# Patient Record
Sex: Female | Born: 1955 | Race: Black or African American | Hispanic: No | Marital: Married | Smoking: Never smoker
Health system: Southern US, Community
[De-identification: ages and names within clinical notes are randomized; demographics above are authoritative.]

## PROBLEM LIST (undated history)

## (undated) DIAGNOSIS — I10 Essential (primary) hypertension: Secondary | ICD-10-CM

## (undated) HISTORY — PX: ROTATOR CUFF REPAIR: SHX139

## (undated) HISTORY — PX: ABDOMINAL HYSTERECTOMY: SHX81

---

## 1993-06-03 HISTORY — PX: BREAST BIOPSY: SHX20

## 2000-10-17 ENCOUNTER — Encounter: Payer: Self-pay | Admitting: Emergency Medicine

## 2000-10-17 ENCOUNTER — Emergency Department (HOSPITAL_COMMUNITY): Admission: EM | Admit: 2000-10-17 | Discharge: 2000-10-18 | Payer: Self-pay | Admitting: Emergency Medicine

## 2004-04-15 ENCOUNTER — Emergency Department: Payer: Self-pay | Admitting: Emergency Medicine

## 2004-05-03 ENCOUNTER — Ambulatory Visit: Payer: Self-pay | Admitting: Internal Medicine

## 2005-04-01 ENCOUNTER — Inpatient Hospital Stay: Payer: Self-pay | Admitting: Internal Medicine

## 2005-05-10 ENCOUNTER — Ambulatory Visit: Payer: Self-pay | Admitting: Internal Medicine

## 2005-06-06 ENCOUNTER — Ambulatory Visit: Payer: Self-pay | Admitting: Internal Medicine

## 2006-01-20 ENCOUNTER — Inpatient Hospital Stay: Payer: Self-pay | Admitting: Internal Medicine

## 2006-04-29 ENCOUNTER — Ambulatory Visit: Payer: Self-pay | Admitting: Internal Medicine

## 2006-05-16 ENCOUNTER — Ambulatory Visit: Payer: Self-pay | Admitting: Podiatry

## 2006-06-25 ENCOUNTER — Ambulatory Visit: Payer: Self-pay | Admitting: Internal Medicine

## 2006-07-03 ENCOUNTER — Ambulatory Visit: Payer: Self-pay | Admitting: Internal Medicine

## 2006-10-19 ENCOUNTER — Inpatient Hospital Stay: Payer: Self-pay | Admitting: Internal Medicine

## 2007-03-18 ENCOUNTER — Ambulatory Visit: Payer: Self-pay | Admitting: Internal Medicine

## 2007-03-25 ENCOUNTER — Ambulatory Visit: Payer: Self-pay | Admitting: Internal Medicine

## 2007-05-25 ENCOUNTER — Emergency Department: Payer: Self-pay | Admitting: Emergency Medicine

## 2007-08-20 ENCOUNTER — Ambulatory Visit: Payer: Self-pay | Admitting: Internal Medicine

## 2008-01-02 ENCOUNTER — Ambulatory Visit: Payer: Self-pay | Admitting: Unknown Physician Specialty

## 2008-02-27 ENCOUNTER — Inpatient Hospital Stay: Payer: Self-pay | Admitting: Unknown Physician Specialty

## 2008-02-27 ENCOUNTER — Other Ambulatory Visit: Payer: Self-pay

## 2008-06-21 ENCOUNTER — Emergency Department: Payer: Self-pay | Admitting: Internal Medicine

## 2008-06-23 ENCOUNTER — Ambulatory Visit: Payer: Self-pay | Admitting: Internal Medicine

## 2008-07-05 ENCOUNTER — Ambulatory Visit: Payer: Self-pay | Admitting: Internal Medicine

## 2009-01-15 ENCOUNTER — Emergency Department: Payer: Self-pay | Admitting: Emergency Medicine

## 2009-02-26 ENCOUNTER — Emergency Department: Payer: Self-pay | Admitting: Emergency Medicine

## 2009-02-27 ENCOUNTER — Ambulatory Visit: Payer: Self-pay | Admitting: Internal Medicine

## 2009-10-06 ENCOUNTER — Emergency Department: Payer: Self-pay | Admitting: Emergency Medicine

## 2009-11-13 ENCOUNTER — Emergency Department: Payer: Self-pay | Admitting: Emergency Medicine

## 2010-08-20 ENCOUNTER — Emergency Department: Payer: Self-pay | Admitting: Emergency Medicine

## 2010-12-26 ENCOUNTER — Inpatient Hospital Stay: Payer: Self-pay | Admitting: Internal Medicine

## 2010-12-31 LAB — PATHOLOGY REPORT

## 2012-06-15 ENCOUNTER — Emergency Department: Payer: Self-pay | Admitting: Emergency Medicine

## 2012-06-16 ENCOUNTER — Emergency Department: Payer: Self-pay | Admitting: Emergency Medicine

## 2012-07-03 DIAGNOSIS — I10 Essential (primary) hypertension: Secondary | ICD-10-CM | POA: Diagnosis present

## 2013-09-15 ENCOUNTER — Emergency Department: Payer: Self-pay | Admitting: Internal Medicine

## 2013-09-15 LAB — CBC WITH DIFFERENTIAL/PLATELET
Basophil #: 0.1 10*3/uL (ref 0.0–0.1)
Basophil %: 1.7 %
Eosinophil #: 0 10*3/uL (ref 0.0–0.7)
Eosinophil %: 0.6 %
HCT: 45.6 % (ref 35.0–47.0)
HGB: 15.4 g/dL (ref 12.0–16.0)
Lymphocyte #: 2.9 10*3/uL (ref 1.0–3.6)
Lymphocyte %: 46.9 %
MCH: 32.1 pg (ref 26.0–34.0)
MCHC: 33.7 g/dL (ref 32.0–36.0)
MCV: 95 fL (ref 80–100)
Monocyte #: 0.6 x10 3/mm (ref 0.2–0.9)
Monocyte %: 9.6 %
Neutrophil #: 2.6 10*3/uL (ref 1.4–6.5)
Neutrophil %: 41.2 %
Platelet: 99 10*3/uL — ABNORMAL LOW (ref 150–440)
RBC: 4.79 10*6/uL (ref 3.80–5.20)
RDW: 12.5 % (ref 11.5–14.5)
WBC: 6.2 10*3/uL (ref 3.6–11.0)

## 2013-09-15 LAB — COMPREHENSIVE METABOLIC PANEL
Albumin: 3.8 g/dL (ref 3.4–5.0)
Alkaline Phosphatase: 147 U/L — ABNORMAL HIGH
Anion Gap: 6 — ABNORMAL LOW (ref 7–16)
BUN: 13 mg/dL (ref 7–18)
Bilirubin,Total: 1.4 mg/dL — ABNORMAL HIGH (ref 0.2–1.0)
Calcium, Total: 9.5 mg/dL (ref 8.5–10.1)
Chloride: 100 mmol/L (ref 98–107)
Co2: 30 mmol/L (ref 21–32)
Creatinine: 0.73 mg/dL (ref 0.60–1.30)
EGFR (African American): >60
EGFR (Non-African Amer.): >60
Glucose: 100 mg/dL — ABNORMAL HIGH (ref 65–99)
Osmolality: 272 (ref 275–301)
Potassium: 3.3 mmol/L — ABNORMAL LOW (ref 3.5–5.1)
SGOT(AST): 138 U/L — ABNORMAL HIGH (ref 15–37)
SGPT (ALT): 171 U/L — ABNORMAL HIGH (ref 12–78)
Sodium: 136 mmol/L (ref 136–145)
Total Protein: 8.8 g/dL — ABNORMAL HIGH (ref 6.4–8.2)

## 2013-09-15 LAB — LIPASE, BLOOD: Lipase: 205 U/L (ref 73–393)

## 2013-09-15 LAB — TROPONIN I: Troponin-I: <0.02 ng/mL

## 2015-08-22 DIAGNOSIS — D696 Thrombocytopenia, unspecified: Secondary | ICD-10-CM | POA: Diagnosis present

## 2015-12-03 ENCOUNTER — Emergency Department
Admission: EM | Admit: 2015-12-03 | Discharge: 2015-12-03 | Disposition: A | Discharge status: Home / Self Care | Payer: BLUE CROSS/BLUE SHIELD | Attending: Emergency Medicine | Admitting: Emergency Medicine

## 2015-12-03 ENCOUNTER — Encounter: Payer: Self-pay | Admitting: Emergency Medicine

## 2015-12-03 DIAGNOSIS — R1030 Lower abdominal pain, unspecified: Secondary | ICD-10-CM | POA: Diagnosis present

## 2015-12-03 DIAGNOSIS — Z79899 Other long term (current) drug therapy: Secondary | ICD-10-CM | POA: Diagnosis not present

## 2015-12-03 DIAGNOSIS — N3090 Cystitis, unspecified without hematuria: Secondary | ICD-10-CM | POA: Insufficient documentation

## 2015-12-03 LAB — URINALYSIS COMPLETE WITH MICROSCOPIC (ARMC ONLY)
BILIRUBIN URINE: NEGATIVE
Glucose, UA: NEGATIVE mg/dL
Hgb urine dipstick: NEGATIVE
KETONES UR: NEGATIVE mg/dL
NITRITE: POSITIVE — AB
PROTEIN: NEGATIVE mg/dL
Specific Gravity, Urine: 1.015 (ref 1.005–1.030)
pH: 6 (ref 5.0–8.0)

## 2015-12-03 LAB — BASIC METABOLIC PANEL
ANION GAP: 7 (ref 5–15)
BUN: 13 mg/dL (ref 6–20)
CO2: 28 mmol/L (ref 22–32)
Calcium: 9.3 mg/dL (ref 8.9–10.3)
Chloride: 105 mmol/L (ref 101–111)
Creatinine, Ser: 0.77 mg/dL (ref 0.44–1.00)
GLUCOSE: 98 mg/dL (ref 65–99)
POTASSIUM: 3.6 mmol/L (ref 3.5–5.1)
Sodium: 140 mmol/L (ref 135–145)

## 2015-12-03 LAB — CBC WITH DIFFERENTIAL/PLATELET
BASOS ABS: 0 10*3/uL (ref 0–0.1)
Basophils Relative: 1 %
Eosinophils Absolute: 0.1 10*3/uL (ref 0–0.7)
Eosinophils Relative: 2 %
HEMATOCRIT: 41.7 % (ref 35.0–47.0)
HEMOGLOBIN: 14.9 g/dL (ref 12.0–16.0)
LYMPHS PCT: 40 %
Lymphs Abs: 1.8 10*3/uL (ref 1.0–3.6)
MCH: 33.5 pg (ref 26.0–34.0)
MCHC: 35.6 g/dL (ref 32.0–36.0)
MCV: 94.2 fL (ref 80.0–100.0)
MONO ABS: 0.5 10*3/uL (ref 0.2–0.9)
Monocytes Relative: 11 %
NEUTROS ABS: 2.1 10*3/uL (ref 1.4–6.5)
NEUTROS PCT: 46 %
Platelets: 110 10*3/uL — ABNORMAL LOW (ref 150–440)
RBC: 4.43 MIL/uL (ref 3.80–5.20)
RDW: 13.5 % (ref 11.5–14.5)
WBC: 4.5 10*3/uL (ref 3.6–11.0)

## 2015-12-03 MED ORDER — NITROFURANTOIN MACROCRYSTAL 100 MG PO CAPS: 100.0000 mg | ORAL_CAPSULE | Freq: Two times a day (BID) | ORAL | Status: DC

## 2015-12-03 NOTE — Discharge Instructions (Signed)

## 2015-12-03 NOTE — ED Notes (Signed)
C/O lower abdominal and low back pain x 2 days.  Symptoms worsening last night.  Having urinary frequency.

## 2015-12-03 NOTE — ED Notes (Signed)
Pt verbalized understanding of discharge instructions. NAD at this time. 

## 2015-12-03 NOTE — ED Provider Notes (Signed)
[facility] Emergency Department Provider Note  ____________________________________________  Time seen: 7:15 AM  I have reviewed the triage vital signs and the nursing notes.   HISTORY  Chief Complaint Abdominal Pain    HPI Sherri Wagner is a 60 y.o. female who complains of diffuse lower abdominal pain radiating to her lower back for the past 2 days. Gradual onset, onset during rest. No recent trips slips or falls or other injuries. Worse when she turns on her side. No nausea vomiting diarrhea. No fever chills or sweats. No dizziness or syncope. No rectal bleeding. No constipation. Is having some urinary frequency but denies dysuria. No abnormal vaginal bleeding or discharge. Denies any history of ovarian cysts or abdominal surgeries.     History reviewed. No pertinent past medical history.   There are no active problems to display for this patient.    History reviewed. No pertinent past surgical history. Foot surgery  Current Outpatient Rx  Name  Route  Sig  Dispense  Refill  . Cholecalciferol (VITAMIN D3) 1000 units CAPS   Oral   Take 1,000 Units by mouth daily.         . ferrous sulfate 325 (65 FE) MG tablet   Oral   Take 325 mg by mouth daily.         . Multiple Vitamin (MULTIVITAMIN) tablet   Oral   Take 1 tablet by mouth daily.         . Omega-3 Fatty Acids (FISH OIL) 1000 MG CAPS   Oral   Take 1,000 mg by mouth 2 (two) times daily.         . vitamin B-12 (CYANOCOBALAMIN) 500 MCG tablet   Oral   Take 500 mcg by mouth daily.         . nitrofurantoin (MACRODANTIN) 100 MG capsule   Oral   Take 1 capsule (100 mg total) by mouth 2 (two) times daily.   6 capsule   0      Allergies Review of patient's allergies indicates no known allergies.   No family history on file.  Social History Social History  Substance Use Topics  . Smoking status: Never Smoker   . Smokeless tobacco: None  . Alcohol Use: Yes      Comment: occasionally    Review of Systems  Constitutional:   No fever or chills.  ENT:   No sore throat. No rhinorrhea. Cardiovascular:   No chest pain. Respiratory:   No dyspnea or cough. Gastrointestinal:   Lower abdominal pain as above without vomiting or diarrhea.  Genitourinary:   Negative for dysuria or difficulty urinating. Musculoskeletal:   Negative for focal pain or swelling Neurological:   Negative for headaches 10-point ROS otherwise negative.  ____________________________________________   PHYSICAL EXAM:  VITAL SIGNS: ED Triage Vitals  Enc Vitals Group     BP 12/03/15 0703 115/74 mmHg     Pulse Rate 12/03/15 0703 61     Resp 12/03/15 0703 16     Temp 12/03/15 0703 97.7 F (36.5 C)     Temp Source 12/03/15 0703 Oral     SpO2 12/03/15 0703 100 %     Weight 12/03/15 0703 165 lb (74.844 kg)     Height 12/03/15 0703 5\' 7"  (1.702 m)     Head Cir --      Peak Flow --      Pain Score 12/03/15 0705 8     Pain Loc --  Pain Edu? --      Excl. in GC? --     Vital signs reviewed, nursing assessments reviewed.   Constitutional:   Alert and oriented. Well appearing and in no distress. Eyes:   No scleral icterus. No conjunctival pallor. PERRL. EOMI.  No nystagmus. ENT   Head:   Normocephalic and atraumatic.   Nose:   No congestion/rhinnorhea. No septal hematoma   Mouth/Throat:   MMM, no pharyngeal erythema. No peritonsillar mass.    Neck:   No stridor. No SubQ emphysema. No meningismus. Hematological/Lymphatic/Immunilogical:   No cervical lymphadenopathy. Cardiovascular:   RRR. Symmetric bilateral radial and DP pulses.  No murmurs.  Respiratory:   Normal respiratory effort without tachypnea nor retractions. Breath sounds are clear and equal bilaterally. No wheezes/rales/rhonchi. Gastrointestinal:   Soft With left lower quadrant tenderness. Non distended. There is no CVA tenderness.  No rebound, rigidity, or guarding. Genitourinary:    deferred Musculoskeletal:   Nontender with normal range of motion in all extremities. No joint effusions.  No lower extremity tenderness.  No edema. Neurologic:   Normal speech and language.  CN 2-10 normal. Motor grossly intact. No gross focal neurologic deficits are appreciated.  Skin:    Skin is warm, dry and intact. No rash noted.  No petechiae, purpura, or bullae.  ____________________________________________    LABS (pertinent positives/negatives) (all labs ordered are listed, but only abnormal results are displayed) Labs Reviewed  URINALYSIS COMPLETEWITH MICROSCOPIC ([facility] ONLY) - Abnormal; Notable for the following:    Color, Urine YELLOW (*)    APPearance CLOUDY (*)    Nitrite POSITIVE (*)    Leukocytes, UA 3+ (*)    Bacteria, UA MANY (*)    Squamous Epithelial / LPF 6-30 (*)    All other components within normal limits  CBC WITH DIFFERENTIAL/PLATELET - Abnormal; Notable for the following:    Platelets 110 (*)    All other components within normal limits  URINE CULTURE  BASIC METABOLIC PANEL   ____________________________________________   EKG    ____________________________________________    RADIOLOGY    ____________________________________________   PROCEDURES   ____________________________________________   INITIAL IMPRESSION / ASSESSMENT AND PLAN / ED COURSE  Pertinent labs & imaging results that were available during my care of the patient were reviewed by me and considered in my medical decision making (see chart for details).  Patient presents with abdominal pain, left lower quadrant tenderness. Check labs and urinalysis. If negative, likely proceed to CT scan to evaluate for diverticulitis versus ovarian cyst.   ----------------------------------------- 9:06 AM on 12/03/2015 -----------------------------------------  Labs show clear nitrite positive urinary tract infection. Vital signs normal. No evidence of pyelonephritis or sepsis.  Discharge home on Macrobid, follow-up with primary care. Urine culture sent.    ____________________________________________   FINAL CLINICAL IMPRESSION(S) / ED DIAGNOSES  Final diagnoses:  Cystitis       Portions of this note were generated with dragon dictation software. Dictation errors may occur despite best attempts at proofreading.   Sharman CheekPhillip Lawson Mahone, MD 12/03/15 [phone]

## 2015-12-05 LAB — URINE CULTURE: Culture: >=100000 — AB

## 2016-02-07 ENCOUNTER — Emergency Department
Admission: EM | Admit: 2016-02-07 | Discharge: 2016-02-07 | Disposition: A | Discharge status: Home / Self Care | Payer: BLUE CROSS/BLUE SHIELD | Attending: Emergency Medicine | Admitting: Emergency Medicine

## 2016-02-07 ENCOUNTER — Encounter: Payer: Self-pay | Admitting: *Deleted

## 2016-02-07 DIAGNOSIS — M5432 Sciatica, left side: Secondary | ICD-10-CM

## 2016-02-07 DIAGNOSIS — M79605 Pain in left leg: Secondary | ICD-10-CM | POA: Diagnosis present

## 2016-02-07 MED ORDER — HYDROCODONE-ACETAMINOPHEN 5-325 MG PO TABS: 1.0000 | ORAL_TABLET | ORAL | 0 refills | Status: DC | PRN

## 2016-02-07 MED ORDER — HYDROCODONE-ACETAMINOPHEN 5-325 MG PO TABS
1.0000 | ORAL_TABLET | Freq: Once | ORAL | Status: AC
  Administered 2016-02-07: 1 via ORAL
  Filled 2016-02-07: qty 1

## 2016-02-07 NOTE — ED Notes (Signed)
Pt reports being dx with siatica last week.  Took prednisone without relief. MAE, +pulse, motor and sensation to foot.  NAD.

## 2016-02-07 NOTE — ED Provider Notes (Signed)
[facility] Emergency Department Provider Note   ____________________________________________   First MD Initiated Contact with Patient 02/07/16 0940     (approximate)  I have reviewed the triage vital signs and the nursing notes.   HISTORY  Chief Complaint Leg Pain   HPI Sherri Wagner is a 60 y.o. female who presents with pain of the left leg.  Pain radiates from the left hip down to the lower leg and is described as a tingling pain and sensation of muscle tightness.  Pain is worse when standing or walking and improves with sitting or lying down.  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  Denies any history of trauma.  Patient saw her PCP about 1 week ago and was diagnosed with sciatica.  She was prescribed diclofenac and methylprednisolone, which she has been taking as directed with no relief of pain.  Patient has also been following the exercises prescribed by the PCP with a fair degree of pain.  She also has tried using [employer], Volteran gel, and heating her left hip and leg without relief of symptoms.  She has an appointment with a provider in her PCP's office scheduled for tomorrow, but the severity of the pain caused her to come to the ED today.    History reviewed. No pertinent past medical history.  There are no active problems to display for this patient.   History reviewed. No pertinent surgical history.  Prior to Admission medications   Medication Sig Start Date End Date Taking? Authorizing Provider  Cholecalciferol (VITAMIN D3) 1000 units CAPS Take 1,000 Units by mouth daily.    Historical Provider, MD  ferrous sulfate 325 (65 FE) MG tablet Take 325 mg by mouth daily.    Historical Provider, MD  HYDROcodone-acetaminophen (NORCO/VICODIN) 5-325 MG tablet Take 1 tablet by mouth every 4 (four) hours as needed for moderate pain. 02/07/16   Tommi Rumps, PA-C  Multiple Vitamin (MULTIVITAMIN) tablet Take 1 tablet by mouth daily.     Historical Provider, MD  nitrofurantoin (MACRODANTIN) 100 MG capsule Take 1 capsule (100 mg total) by mouth 2 (two) times daily. 12/03/15   Sharman Cheek, MD  Omega-3 Fatty Acids (FISH OIL) 1000 MG CAPS Take 1,000 mg by mouth 2 (two) times daily.    Historical Provider, MD  vitamin B-12 (CYANOCOBALAMIN) 500 MCG tablet Take 500 mcg by mouth daily.    Historical Provider, MD    Allergies Review of patient's allergies indicates no known allergies.  No family history on file.  Social History Social History  Substance Use Topics  . Smoking status: Never Smoker  . Smokeless tobacco: Never Used  . Alcohol use Yes     Comment: occasionally    Review of Systems Constitutional: No fever/chills Gastrointestinal: No abdominal pain. Negative for bowel incontinence. Genitourinary: Negative for bladder incontinence. Musculoskeletal: Negative for back pain, but endorses left hip pain and left leg pain. Neurological: Negative for headaches, focal weakness or numbness. Patient endorses tingling sensation throughout the left leg.  10-point ROS otherwise negative.  ____________________________________________   PHYSICAL EXAM:  VITAL SIGNS: ED Triage Vitals  Enc Vitals Group     BP 02/07/16 0847 138/88     Pulse Rate 02/07/16 0847 68     Resp 02/07/16 0847 20     Temp 02/07/16 0847 97.4 F (36.3 C)     Temp Source 02/07/16 0847 Oral     SpO2 02/07/16 0847 99 %     Weight  02/07/16 0844 160 lb (72.6 kg)     Height 02/07/16 0844 5\' 7"  (1.702 m)     Head Circumference --      Peak Flow --      Pain Score 02/07/16 0844 10     Pain Loc --      Pain Edu? --      Excl. in GC? --     Constitutional: Alert and oriented. Well appearing and in no acute distress. Cardiovascular: Normal rate, regular rhythm. Grossly normal heart sounds.  Good peripheral circulation.  Distal pulses intact bilaterally. Respiratory: Normal respiratory effort.  No retractions. Lungs CTAB. Musculoskeletal: Full ROM  of hips, knees, and ankles bilaterally.  Muscle strength 5/5, except pain limited strength of left hip flexion.  No lower extremity edema.  Neurologic:  Normal speech and language. No gross focal neurologic deficits are appreciated. No gait instability. Sensation grossly intact.Reflexes were 1+ bilaterally. Skin:  Skin is warm, dry and intact. No rash noted. Psychiatric: Mood and affect are normal. Speech and behavior are normal.    PROCEDURES  Procedure(s) performed: None  Procedures  Critical Care performed: No  ____________________________________________   INITIAL IMPRESSION / ASSESSMENT AND PLAN / ED COURSE  Patient appears to be experiencing continued symptoms of sciatica with leg pain and tingling.  The pain she is experiencing is severe enough to limit her daily activities.  As her current medications and conservative treatments are not improving symptoms, will provide patient with 2 day supply of Vicodin.  She will follow-up with her PCP office tomorrow to discuss long-term treatment options.  Encouraged patient to continue exercises as tolerated.     Clinical Course   Patient is to keep her appointment with her primary care doctor tomorrow. Patient is to take Vicodin as directed and continue her current medication.  ____________________________________________   FINAL CLINICAL IMPRESSION(S) / ED DIAGNOSES  Final diagnoses:  Sciatica without back pain, left      NEW MEDICATIONS STARTED DURING THIS VISIT:  Discharge Medication List as of 02/07/2016 10:19 AM    START taking these medications   Details  HYDROcodone-acetaminophen (NORCO/VICODIN) 5-325 MG tablet Take 1 tablet by mouth every 4 (four) hours as needed for moderate pain., Starting Wed 02/07/2016, Print         Note:  This document was prepared using Dragon voice recognition software and may include unintentional dictation errors.    Tommi RumpsRhonda L Silvina Hackleman, PA-C 02/07/16 1346    Arnaldo NatalPaul F Malinda,  MD 02/07/16 1452

## 2016-02-07 NOTE — ED Triage Notes (Signed)
Pt complains of left leg pain starting at buttocks down to foot, pt saw PCP last wek placed on prednisone, pt reports no relief, no swelling noted

## 2016-04-17 DIAGNOSIS — G4733 Obstructive sleep apnea (adult) (pediatric): Secondary | ICD-10-CM | POA: Diagnosis present

## 2016-07-14 ENCOUNTER — Emergency Department: Payer: BLUE CROSS/BLUE SHIELD

## 2016-07-14 ENCOUNTER — Emergency Department
Admission: EM | Admit: 2016-07-14 | Discharge: 2016-07-14 | Disposition: A | Discharge status: Home / Self Care | Payer: BLUE CROSS/BLUE SHIELD | Attending: Emergency Medicine | Admitting: Emergency Medicine

## 2016-07-14 ENCOUNTER — Encounter: Payer: Self-pay | Admitting: Emergency Medicine

## 2016-07-14 DIAGNOSIS — J069 Acute upper respiratory infection, unspecified: Secondary | ICD-10-CM | POA: Insufficient documentation

## 2016-07-14 DIAGNOSIS — R05 Cough: Secondary | ICD-10-CM | POA: Diagnosis present

## 2016-07-14 DIAGNOSIS — Z79899 Other long term (current) drug therapy: Secondary | ICD-10-CM | POA: Diagnosis not present

## 2016-07-14 LAB — TROPONIN I: Troponin I: <0.03 ng/mL (ref <0.03)

## 2016-07-14 LAB — INFLUENZA PANEL BY PCR (TYPE A & B)
INFLAPCR: NEGATIVE
INFLBPCR: NEGATIVE

## 2016-07-14 LAB — COMPREHENSIVE METABOLIC PANEL
ALK PHOS: 54 U/L (ref 38–126)
ALT: 72 U/L — AB (ref 14–54)
ANION GAP: 7 (ref 5–15)
AST: 188 U/L — ABNORMAL HIGH (ref 15–41)
Albumin: 4.9 g/dL (ref 3.5–5.0)
BUN: 17 mg/dL (ref 6–20)
CALCIUM: 9.4 mg/dL (ref 8.9–10.3)
CO2: 29 mmol/L (ref 22–32)
CREATININE: 0.86 mg/dL (ref 0.44–1.00)
Chloride: 104 mmol/L (ref 101–111)
Glucose, Bld: 102 mg/dL — ABNORMAL HIGH (ref 65–99)
Potassium: 3.5 mmol/L (ref 3.5–5.1)
SODIUM: 140 mmol/L (ref 135–145)
Total Bilirubin: 1.2 mg/dL (ref 0.3–1.2)
Total Protein: 8.5 g/dL — ABNORMAL HIGH (ref 6.5–8.1)

## 2016-07-14 LAB — CBC WITH DIFFERENTIAL/PLATELET
Basophils Absolute: 0 10*3/uL (ref 0–0.1)
Basophils Relative: 0 %
EOS ABS: 0 10*3/uL (ref 0–0.7)
EOS PCT: 0 %
HCT: 42.1 % (ref 35.0–47.0)
HEMOGLOBIN: 14.7 g/dL (ref 12.0–16.0)
LYMPHS ABS: 1 10*3/uL (ref 1.0–3.6)
LYMPHS PCT: 12 %
MCH: 32 pg (ref 26.0–34.0)
MCHC: 35.1 g/dL (ref 32.0–36.0)
MCV: 91.3 fL (ref 80.0–100.0)
MONOS PCT: 5 %
Monocytes Absolute: 0.4 10*3/uL (ref 0.2–0.9)
Neutro Abs: 6.7 10*3/uL — ABNORMAL HIGH (ref 1.4–6.5)
Neutrophils Relative %: 83 %
PLATELETS: 121 10*3/uL — AB (ref 150–440)
RBC: 4.61 MIL/uL (ref 3.80–5.20)
RDW: 12.5 % (ref 11.5–14.5)
WBC: 8.2 10*3/uL (ref 3.6–11.0)

## 2016-07-14 MED ORDER — IBUPROFEN 600 MG PO TABS
600.0000 mg | ORAL_TABLET | Freq: Once | ORAL | Status: AC
  Administered 2016-07-14: 600 mg via ORAL
  Filled 2016-07-14: qty 1

## 2016-07-14 MED ORDER — GUAIFENESIN 200 MG PO TABS
200.0000 mg | ORAL_TABLET | ORAL | Status: DC | PRN
Start: 2016-07-14 — End: 2016-07-14
  Filled 2016-07-14: qty 1

## 2016-07-14 MED ORDER — GUAIFENESIN 100 MG/5ML PO SOLN
10.0000 mL | ORAL | Status: DC | PRN
  Administered 2016-07-14: 200 mg via ORAL
  Filled 2016-07-14 (×2): qty 10

## 2016-07-14 NOTE — Discharge Instructions (Signed)
You were evaluated for body aches and congestion, and as we discussed your exam and evaluation are overall reassuring in the emergency department stay. I did discuss, I am most suspicious of a virus. You may try over-the-counter Mucinex for mucus, and ibuprofen and/or Tylenol for body aches and/or fever.  Return to the emergency department immediately if you have any worsening symptoms including any trouble breathing, no worsening shortness of breath, chest pain, dizziness or passing out, abdominal pain, vomiting or diarrhea and concern for dehydration, confusion altered mental status, or any other symptoms concerning to you.

## 2016-07-14 NOTE — ED Triage Notes (Signed)
Pt states sudden onset of chills, sore throat, cough, nasal congestion at 2230. Pt states she now has chest pain. Pt masked in triage.

## 2016-07-14 NOTE — ED Provider Notes (Signed)
[facility] Emergency Department Provider Note ____________________________________________   I have reviewed the triage vital signs and the triage nursing note.  HISTORY  Chief Complaint Cough; Fever; Chills; and Sore Throat   Historian Patient  HPI Sherri Wagner is a 61 y.o. female presents having woke up this morning feeling congested and body aches. Mild central chest pressure. She's had mucus production and she is clearing her throat. Not sure she's had a fever or not but feels like she may have had chills. No nausea vomiting or diarrhea. No abdominal pain. Symptoms are moderate.    History reviewed. No pertinent past medical history.  There are no active problems to display for this patient.   History reviewed. No pertinent surgical history.  Prior to Admission medications   Medication Sig Start Date End Date Taking? Authorizing Provider  Cholecalciferol (VITAMIN D3) 1000 units CAPS Take 1,000 Units by mouth daily.    Historical Provider, MD  ferrous sulfate 325 (65 FE) MG tablet Take 325 mg by mouth daily.    Historical Provider, MD  HYDROcodone-acetaminophen (NORCO/VICODIN) 5-325 MG tablet Take 1 tablet by mouth every 4 (four) hours as needed for moderate pain. 02/07/16   Tommi Rumps, PA-C  Multiple Vitamin (MULTIVITAMIN) tablet Take 1 tablet by mouth daily.    Historical Provider, MD  nitrofurantoin (MACRODANTIN) 100 MG capsule Take 1 capsule (100 mg total) by mouth 2 (two) times daily. 12/03/15   Sharman Cheek, MD  Omega-3 Fatty Acids (FISH OIL) 1000 MG CAPS Take 1,000 mg by mouth 2 (two) times daily.    Historical Provider, MD  vitamin B-12 (CYANOCOBALAMIN) 500 MCG tablet Take 500 mcg by mouth daily.    Historical Provider, MD    No Known Allergies  History reviewed. No pertinent family history.  Social History Social History  Substance Use Topics  . Smoking status: Never Smoker  . Smokeless tobacco: Never Used  . Alcohol use Yes      Comment: occasionally    Review of Systems  Constitutional: Positive for chills. Eyes: Negative for visual changes. ENT: Negative for sore throat. Cardiovascular: Negative for pleuritic chest pain. Respiratory: Negative for shortness of breath. Gastrointestinal: Negative for abdominal pain, vomiting and diarrhea. Genitourinary: Negative for dysuria. Musculoskeletal: Negative for back pain. Skin: Negative for rash. Neurological: Negative for headache. 10 point Review of Systems otherwise negative ____________________________________________   PHYSICAL EXAM:  VITAL SIGNS: ED Triage Vitals  Enc Vitals Group     BP 07/14/16 0258 123/85     Pulse Rate 07/14/16 0258 76     Resp 07/14/16 0258 20     Temp 07/14/16 0258 99.8 F (37.7 C)     Temp Source 07/14/16 0258 Oral     SpO2 07/14/16 0258 99 %     Weight 07/14/16 0258 166 lb (75.3 kg)     Height 07/14/16 0258 5\' 7"  (1.702 m)     Head Circumference --      Peak Flow --      Pain Score 07/14/16 0259 10     Pain Loc --      Pain Edu? --      Excl. in GC? --      Constitutional: Alert and oriented. Well appearing and in no distress. HEENT   Head: Normocephalic and atraumatic.      Eyes: Conjunctivae are normal. PERRL. Normal extraocular movements.      Ears:         Nose: No congestion/rhinnorhea.   Mouth/Throat:  Mucous membranes are moist.   Neck: No stridor. Cardiovascular/Chest: Normal rate, regular rhythm.  No murmurs, rubs, or gallops. Respiratory: Normal respiratory effort without tachypnea nor retractions. Breath sounds are clear and equal bilaterally. No wheezes/rales/rhonchi.  Occasional cough and clearing her throat of mucus. Gastrointestinal: Soft. No distention, no guarding, no rebound. Nontender.    Genitourinary/rectal:Deferred Musculoskeletal: Nontender with normal range of motion in all extremities. No joint effusions.  No lower extremity tenderness.  No edema. Neurologic:  Normal speech  and language. No gross or focal neurologic deficits are appreciated. Skin:  Skin is warm, dry and intact. No rash noted. Psychiatric: Mood and affect are normal. Speech and behavior are normal. Patient exhibits appropriate insight and judgment.   ____________________________________________  LABS (pertinent positives/negatives)  Labs Reviewed  CBC WITH DIFFERENTIAL/PLATELET - Abnormal; Notable for the following:       Result Value   Platelets 121 (*)    Neutro Abs 6.7 (*)    All other components within normal limits  COMPREHENSIVE METABOLIC PANEL - Abnormal; Notable for the following:    Glucose, Bld 102 (*)    Total Protein 8.5 (*)    AST 188 (*)    ALT 72 (*)    All other components within normal limits  TROPONIN I  INFLUENZA PANEL BY PCR (TYPE A & B)    ____________________________________________    EKG I, Governor Rooksebecca Venera Privott, MD, the attending physician have personally viewed and interpreted all ECGs.  74 bpm. Normal sinus rhythm with occasional PVC. Narrow QRS normal axis. Nonspecific T-wave. ____________________________________________  RADIOLOGY All Xrays were viewed by me. Imaging interpreted by Radiologist.  Chest x-ray two-view: No active cardiopulmonary disease. __________________________________________  PROCEDURES  Procedure(s) performed: None  Critical Care performed: None  ____________________________________________   ED COURSE / ASSESSMENT AND PLAN  Pertinent labs & imaging results that were available during my care of the patient were reviewed by me and considered in my medical decision making (see chart for details).   Sherri Wagner is here with symptoms for about 6 hours now cough congestion and body aches. Stable vital signs with no documented fever or reported. There other than subjective chills. No hypotension or tachycardia. O2 sat 100% on room air.  On exam lungs are essentially clear without wheezing. Chest x-ray shows no  infiltrate.  Flu test negative. Laboratory studies are reassuring, normal white blood cell count, but mild left shift. LFTs slightly elevated, but less than previous.  She does appear to have some sort of viral upper respiratory illness. I think she is okay for outpatient management. We discussed return precautions. She does have a primary care doctor that she will call for follow-up.    CONSULTATIONS:  None   Patient / Family / Caregiver informed of clinical course, medical decision-making process, and agree with plan.   I discussed return precautions, follow-up instructions, and discharge instructions with patient and/or family.   ___________________________________________   FINAL CLINICAL IMPRESSION(S) / ED DIAGNOSES   Final diagnoses:  Upper respiratory tract infection, unspecified type              Note: This dictation was prepared with Dragon dictation. Any transcriptional errors that result from this process are unintentional    Governor Rooksebecca Kaisyn Reinhold, MD 07/14/16 [phone]

## 2016-07-15 ENCOUNTER — Ambulatory Visit: Payer: BLUE CROSS/BLUE SHIELD | Admitting: Occupational Therapy

## 2016-10-26 ENCOUNTER — Emergency Department: Payer: BLUE CROSS/BLUE SHIELD

## 2016-10-26 ENCOUNTER — Emergency Department
Admission: EM | Admit: 2016-10-26 | Discharge: 2016-10-26 | Disposition: A | Discharge status: Home / Self Care | Payer: BLUE CROSS/BLUE SHIELD | Attending: Emergency Medicine | Admitting: Emergency Medicine

## 2016-10-26 DIAGNOSIS — R112 Nausea with vomiting, unspecified: Secondary | ICD-10-CM | POA: Diagnosis not present

## 2016-10-26 DIAGNOSIS — K210 Gastro-esophageal reflux disease with esophagitis: Secondary | ICD-10-CM | POA: Diagnosis not present

## 2016-10-26 DIAGNOSIS — R1011 Right upper quadrant pain: Secondary | ICD-10-CM | POA: Diagnosis not present

## 2016-10-26 DIAGNOSIS — Z79899 Other long term (current) drug therapy: Secondary | ICD-10-CM | POA: Diagnosis not present

## 2016-10-26 DIAGNOSIS — K219 Gastro-esophageal reflux disease without esophagitis: Secondary | ICD-10-CM

## 2016-10-26 DIAGNOSIS — R1013 Epigastric pain: Secondary | ICD-10-CM | POA: Diagnosis present

## 2016-10-26 LAB — URINALYSIS, COMPLETE (UACMP) WITH MICROSCOPIC
BILIRUBIN URINE: NEGATIVE
Bacteria, UA: NONE SEEN
GLUCOSE, UA: NEGATIVE mg/dL
Hgb urine dipstick: NEGATIVE
KETONES UR: NEGATIVE mg/dL
LEUKOCYTES UA: NEGATIVE
NITRITE: NEGATIVE
PH: 7 (ref 5.0–8.0)
Protein, ur: NEGATIVE mg/dL
SPECIFIC GRAVITY, URINE: 1.004 — AB (ref 1.005–1.030)

## 2016-10-26 LAB — CBC
HCT: 46.1 % (ref 35.0–47.0)
HEMOGLOBIN: 16 g/dL (ref 12.0–16.0)
MCH: 32.1 pg (ref 26.0–34.0)
MCHC: 34.7 g/dL (ref 32.0–36.0)
MCV: 92.7 fL (ref 80.0–100.0)
Platelets: 174 10*3/uL (ref 150–440)
RBC: 4.98 MIL/uL (ref 3.80–5.20)
RDW: 12.6 % (ref 11.5–14.5)
WBC: 7.7 10*3/uL (ref 3.6–11.0)

## 2016-10-26 LAB — COMPREHENSIVE METABOLIC PANEL
ALK PHOS: 133 U/L — AB (ref 38–126)
ALT: 106 U/L — AB (ref 14–54)
ANION GAP: 8 (ref 5–15)
AST: 73 U/L — ABNORMAL HIGH (ref 15–41)
Albumin: 4.6 g/dL (ref 3.5–5.0)
BILIRUBIN TOTAL: 1.2 mg/dL (ref 0.3–1.2)
BUN: 11 mg/dL (ref 6–20)
CALCIUM: 9.5 mg/dL (ref 8.9–10.3)
CO2: 27 mmol/L (ref 22–32)
CREATININE: 0.84 mg/dL (ref 0.44–1.00)
Chloride: 102 mmol/L (ref 101–111)
GFR calc non Af Amer: >60 mL/min (ref >60)
Glucose, Bld: 117 mg/dL — ABNORMAL HIGH (ref 65–99)
Potassium: 3.5 mmol/L (ref 3.5–5.1)
Sodium: 137 mmol/L (ref 135–145)
TOTAL PROTEIN: 8.3 g/dL — AB (ref 6.5–8.1)

## 2016-10-26 LAB — LIPASE, BLOOD: Lipase: 46 U/L (ref 11–51)

## 2016-10-26 MED ORDER — SODIUM CHLORIDE 0.9 % IV BOLUS (SEPSIS)
1000.0000 mL | Freq: Once | INTRAVENOUS | Status: AC
  Administered 2016-10-26: 1000 mL via INTRAVENOUS

## 2016-10-26 MED ORDER — FAMOTIDINE IN NACL 20-0.9 MG/50ML-% IV SOLN
20.0000 mg | Freq: Once | INTRAVENOUS | Status: AC
  Administered 2016-10-26: 20 mg via INTRAVENOUS
  Filled 2016-10-26: qty 50

## 2016-10-26 MED ORDER — GI COCKTAIL ~~LOC~~
30.0000 mL | Freq: Once | ORAL | Status: AC
  Administered 2016-10-26: 30 mL via ORAL
  Filled 2016-10-26: qty 30

## 2016-10-26 MED ORDER — ONDANSETRON 4 MG PO TBDP: 4.0000 mg | ORAL_TABLET | Freq: Three times a day (TID) | ORAL | 0 refills | Status: AC | PRN

## 2016-10-26 MED ORDER — ALUM & MAG HYDROXIDE-SIMETH 400-400-40 MG/5ML PO SUSP: 5.0000 mL | Freq: Four times a day (QID) | ORAL | 0 refills | Status: AC | PRN

## 2016-10-26 MED ORDER — FAMOTIDINE 20 MG PO TABS: 20.0000 mg | ORAL_TABLET | Freq: Two times a day (BID) | ORAL | 1 refills | Status: DC

## 2016-10-26 MED ORDER — ACETAMINOPHEN 500 MG PO TABS
1000.0000 mg | ORAL_TABLET | Freq: Once | ORAL | Status: AC
  Administered 2016-10-26: 1000 mg via ORAL
  Filled 2016-10-26: qty 2

## 2016-10-26 MED ORDER — ONDANSETRON HCL 4 MG/2ML IJ SOLN
4.0000 mg | Freq: Once | INTRAMUSCULAR | Status: AC
  Administered 2016-10-26: 4 mg via INTRAVENOUS
  Filled 2016-10-26: qty 2

## 2016-10-26 NOTE — Discharge Instructions (Signed)
Take pepcid everyday as prescribed. Take zofran as needed for nausea and vomiting. Take maalox as needed for severe episodes of burning. Follow up with your doctor in 3 days or return to the ER if you have new or worsening chest pain, abdominal pain, shortness of breath, fever, or any new symptoms concerning to you.

## 2016-10-26 NOTE — ED Notes (Signed)
Pt states unable to give urine sample at this time.  Family member states "how can she give a urine sample she can barely walk."  Family and pt informed accommodations can be made like moving bed close to toilet and using hat in toilet.

## 2016-10-26 NOTE — ED Triage Notes (Signed)
Reports abdominal pain for 1 week, seen yesterday at MD office. Reports given Ranitidine 150 mg.  Reports tonight after work ate some fish and slaw and now pain worse and feels like stomach swelling.  Patient reports pain mid abdomen.

## 2016-10-26 NOTE — ED Provider Notes (Signed)
[facility] Emergency Department Provider Note  ____________________________________________  Time seen: Approximately 3:06 AM  I have reviewed the triage vital signs and the nursing notes.   HISTORY  Chief Complaint Abdominal Pain   HPI Sherri Wagner is a 61 y.o. female with a history of hepatitis C status post treatment a year ago and arthritis who presents for evaluation of abdominal pain. Patient reports 1 week of constant epigastric abdominal pain that she describes as pressure and burning sensation, worse postprandially, nonradiating, currently 9 out of 10. Patient has had nausea and today had 2 episodes of nonbloody nonbilious emesis. Patient denies prior history of reflux. No prior abdominal surgeries. The pain got worse today after eating coleslaw and fish sticks. Patient denies fever or chills, dysuria hematuria, chest pain or shortness of breath. She has been constipated for the last few days. No abdominal distention. She is passing flatus. No prior history of SBO. Patient was seen by her primary care doctor yesterday and started on ranitidine. She denies melena, coffee-ground emesis, hematemesis. She reports that she was using Aleve frequently the week before her symptoms started. She drinks alcohol occasionally with the last consumption week ago.  No past medical history on file.  There are no active problems to display for this patient.   No past surgical history on file.  Prior to Admission medications   Medication Sig Start Date End Date Taking? Authorizing Provider  alum & mag hydroxide-simeth (MAALOX MAX) 400-400-40 MG/5ML suspension Take 5 mLs by mouth every 6 (six) hours as needed for indigestion. 10/26/16   Nita Sickle, MD  Cholecalciferol (VITAMIN D3) 1000 units CAPS Take 1,000 Units by mouth daily.    [provider]  famotidine (PEPCID) 20 MG tablet Take 1 tablet (20 mg total) by mouth 2 (two) times daily. 10/26/16  10/26/17  Nita Sickle, MD  ferrous sulfate 325 (65 FE) MG tablet Take 325 mg by mouth daily.    [provider]  HYDROcodone-acetaminophen (NORCO/VICODIN) 5-325 MG tablet Take 1 tablet by mouth every 4 (four) hours as needed for moderate pain. 02/07/16   Tommi Rumps, PA-C  Multiple Vitamin (MULTIVITAMIN) tablet Take 1 tablet by mouth daily.    [provider]  nitrofurantoin (MACRODANTIN) 100 MG capsule Take 1 capsule (100 mg total) by mouth 2 (two) times daily. 12/03/15   Sharman Cheek, MD  Omega-3 Fatty Acids (FISH OIL) 1000 MG CAPS Take 1,000 mg by mouth 2 (two) times daily.    [provider]  ondansetron (ZOFRAN ODT) 4 MG disintegrating tablet Take 1 tablet (4 mg total) by mouth every 8 (eight) hours as needed for nausea or vomiting. 10/26/16   Don Perking, [place], MD  vitamin B-12 (CYANOCOBALAMIN) 500 MCG tablet Take 500 mcg by mouth daily.    [provider]    Allergies Patient has no known allergies.  No family history on file.  Social History Social History  Substance Use Topics  . Smoking status: Never Smoker  . Smokeless tobacco: Never Used  . Alcohol use Yes     Comment: occasionally    Review of Systems  Constitutional: Negative for fever. Eyes: Negative for visual changes. ENT: Negative for sore throat. Neck: No neck pain  Cardiovascular: Negative for chest pain. Respiratory: Negative for shortness of breath. Gastrointestinal: + epigastric abdominal pain, nausea, and vomiting. No diarrhea. Genitourinary: Negative for dysuria. Musculoskeletal: Negative for back pain. Skin: Negative for rash. Neurological: Negative for headaches, weakness or numbness. Psych: No SI or  HI  ____________________________________________   PHYSICAL EXAM:  VITAL SIGNS: ED Triage Vitals  Enc Vitals Group     BP 10/26/16 0044 (!) 165/98     Pulse Rate 10/26/16 0044 (!) 58     Resp 10/26/16 0044 20     Temp 10/26/16 0044 97.5 F (36.4  C)     Temp Source 10/26/16 0044 Oral     SpO2 10/26/16 0044 99 %     Weight 10/26/16 0044 162 lb (73.5 kg)     Height 10/26/16 0044 5' 7.5" (1.715 m)     Head Circumference --      Peak Flow --      Pain Score 10/26/16 0042 10     Pain Loc --      Pain Edu? --      Excl. in GC? --     Constitutional: Alert and oriented. Well appearing and in no apparent distress. HEENT:      Head: Normocephalic and atraumatic.         Eyes: Conjunctivae are normal. Sclera is non-icteric.       Mouth/Throat: Mucous membranes are moist.       Neck: Supple with no signs of meningismus. Cardiovascular: Regular rate and rhythm. No murmurs, gallops, or rubs. 2+ symmetrical distal pulses are present in all extremities. No JVD. Respiratory: Normal respiratory effort. Lungs are clear to auscultation bilaterally. No wheezes, crackles, or rhonchi.  Gastrointestinal: Soft, ttp over the epigastric and RUQ region, negative Murphy's sign, and non distended with positive bowel sounds. No rebound or guarding. Genitourinary: No CVA tenderness. Musculoskeletal: Nontender with normal range of motion in all extremities. No edema, cyanosis, or erythema of extremities. Neurologic: Normal speech and language. Face is symmetric. Moving all extremities. No gross focal neurologic deficits are appreciated. Skin: Skin is warm, dry and intact. No rash noted. Psychiatric: Mood and affect are normal. Speech and behavior are normal.  ____________________________________________   LABS (all labs ordered are listed, but only abnormal results are displayed)  Labs Reviewed  COMPREHENSIVE METABOLIC PANEL - Abnormal; Notable for the following:       Result Value   Glucose, Bld 117 (*)    Total Protein 8.3 (*)    AST 73 (*)    ALT 106 (*)    Alkaline Phosphatase 133 (*)    All other components within normal limits  URINALYSIS, COMPLETE (UACMP) WITH MICROSCOPIC - Abnormal; Notable for the following:    Color, Urine STRAW (*)     APPearance CLEAR (*)    Specific Gravity, Urine 1.004 (*)    Squamous Epithelial / LPF 0-5 (*)    All other components within normal limits  LIPASE, BLOOD  CBC   ____________________________________________  EKG  none  ____________________________________________  RADIOLOGY  RUQ US: No evidence of cholelithiasis or cholecystitis. Mild prominence of portal vein probably related to hepatitis-C. ____________________________________________   PROCEDURES  Procedure(s) performed: None Procedures Critical Care performed:  None ____________________________________________   INITIAL IMPRESSION / ASSESSMENT AND PLAN / ED COURSE  61 y.o. female with a history of hepatitis C status post treatment a year ago and arthritis who presents for evaluation of 1 week of constant epigastric and RUQ abdominal pressure and burning worse post-prandially and associated with N/V. Patient is well-appearing, in no distress, has normal vital signs, she is tender to palpation on the epigastric and right upper quadrant regions with no rebound or guarding, negative Murphy sign. Differential diagnoses including GERD versus gastritis versus peptic ulcer disease  versus pancreatitis versus gallbladder disease. We'll give a GI cocktail, IV Pepcid, IV Zofran, IV fluids, and Tylenol. We'll check labs and right upper quadrant ultrasound.    _________________________ 5:00 AM on 10/26/2016 -----------------------------------------  Patient's symptoms fully resolved. She is tolerating by mouth with no abdominal discomfort. Right upper quadrant ultrasound with no acute findings. CMP showing mildly elevated LFTs which is patient's baseline. Lipase is normal. Normal white count. UA with no evidence of UTI. Patient is given a be discharged home on Pepcid, Zofran, Maalox, and follow-up with primary care doctor. Recommended avoiding NSAIDs.   Pertinent labs & imaging results that were available during my care of the patient  were reviewed by me and considered in my medical decision making (see chart for details).    ____________________________________________   FINAL CLINICAL IMPRESSION(S) / ED DIAGNOSES  Final diagnoses:  RUQ abdominal pain  Gastroesophageal reflux disease, esophagitis presence not specified  Non-intractable vomiting with nausea, unspecified vomiting type      NEW MEDICATIONS STARTED DURING THIS VISIT:  New Prescriptions   ALUM & MAG HYDROXIDE-SIMETH (MAALOX MAX) 400-400-40 MG/5ML SUSPENSION    Take 5 mLs by mouth every 6 (six) hours as needed for indigestion.   FAMOTIDINE (PEPCID) 20 MG TABLET    Take 1 tablet (20 mg total) by mouth 2 (two) times daily.   ONDANSETRON (ZOFRAN ODT) 4 MG DISINTEGRATING TABLET    Take 1 tablet (4 mg total) by mouth every 8 (eight) hours as needed for nausea or vomiting.     Note:  This document was prepared using Dragon voice recognition software and may include unintentional dictation errors.    Don Perking, [place], MD 10/26/16 [phone]

## 2017-10-03 ENCOUNTER — Other Ambulatory Visit: Payer: Self-pay

## 2017-10-03 ENCOUNTER — Encounter: Payer: Self-pay | Admitting: Emergency Medicine

## 2017-10-03 ENCOUNTER — Emergency Department
Admission: EM | Admit: 2017-10-03 | Discharge: 2017-10-03 | Disposition: A | Discharge status: Home / Self Care | Payer: BLUE CROSS/BLUE SHIELD | Attending: Emergency Medicine | Admitting: Emergency Medicine

## 2017-10-03 DIAGNOSIS — Z79899 Other long term (current) drug therapy: Secondary | ICD-10-CM | POA: Insufficient documentation

## 2017-10-03 DIAGNOSIS — M25511 Pain in right shoulder: Secondary | ICD-10-CM | POA: Insufficient documentation

## 2017-10-03 DIAGNOSIS — G8929 Other chronic pain: Secondary | ICD-10-CM

## 2017-10-03 MED ORDER — GABAPENTIN 100 MG PO CAPS: 100.0000 mg | ORAL_CAPSULE | Freq: Three times a day (TID) | ORAL | 0 refills | Status: DC

## 2017-10-03 NOTE — ED Triage Notes (Addendum)
Patient ambulatory to triage with steady gait, without difficulty or distress noted; pt reports sched for MRI of right shoulder tomorrow; having pain x 6-52months; cortisone inj 3mos ago with some relief by orthopedics in [facility]; st throbbing pain with ROM

## 2017-10-03 NOTE — ED Provider Notes (Signed)
[facility] Emergency Department Provider Note  ____________________________________________  Time seen: Approximately 5:11 AM  I have reviewed the triage vital signs and the nursing notes.   HISTORY  Chief Complaint Shoulder Pain    HPI Sherri Wagner is a 62 y.o. female who complains of chronic right shoulder pain. She sees sports medicine and primary care, is scheduled for an MRI, however had to cancel it because she is scheduled at a place that is not covered by her health insurance plan. Comes to the ED tonight for evaluation of the right shoulder pain which is unchanged and that her chronic baseline. Denies fever chills chest pain shortness of breath or weakness. Pain is throbbing, intermittent, waxing and waning, worse with movement, no alleviating factors      History reviewed. No pertinent past medical history.   There are no active problems to display for this patient.    History reviewed. No pertinent surgical history.   Prior to Admission medications   Medication Sig Start Date End Date Taking? Authorizing Provider  alum & mag hydroxide-simeth (MAALOX MAX) 400-400-40 MG/5ML suspension Take 5 mLs by mouth every 6 (six) hours as needed for indigestion. 10/26/16   Nita Sickle, MD  Cholecalciferol (VITAMIN D3) 1000 units CAPS Take 1,000 Units by mouth daily.    [provider]  famotidine (PEPCID) 20 MG tablet Take 1 tablet (20 mg total) by mouth 2 (two) times daily. 10/26/16 10/26/17  Nita Sickle, MD  ferrous sulfate 325 (65 FE) MG tablet Take 325 mg by mouth daily.    [provider]  gabapentin (NEURONTIN) 100 MG capsule Take 1 capsule (100 mg total) by mouth 3 (three) times daily for 15 days. 10/03/17 10/18/17  Sharman Cheek, MD  HYDROcodone-acetaminophen (NORCO/VICODIN) 5-325 MG tablet Take 1 tablet by mouth every 4 (four) hours as needed for moderate pain. 02/07/16   Tommi Rumps, PA-C  Multiple Vitamin  (MULTIVITAMIN) tablet Take 1 tablet by mouth daily.    [provider]  nitrofurantoin (MACRODANTIN) 100 MG capsule Take 1 capsule (100 mg total) by mouth 2 (two) times daily. 12/03/15   Sharman Cheek, MD  Omega-3 Fatty Acids (FISH OIL) 1000 MG CAPS Take 1,000 mg by mouth 2 (two) times daily.    [provider]  ondansetron (ZOFRAN ODT) 4 MG disintegrating tablet Take 1 tablet (4 mg total) by mouth every 8 (eight) hours as needed for nausea or vomiting. 10/26/16   Don Perking, [place], MD  vitamin B-12 (CYANOCOBALAMIN) 500 MCG tablet Take 500 mcg by mouth daily.    [provider]     Allergies Patient has no known allergies.   No family history on file.  Social History Social History   Tobacco Use  . Smoking status: Never Smoker  . Smokeless tobacco: Never Used  Substance Use Topics  . Alcohol use: Yes    Comment: occasionally  . Drug use: No    Review of Systems  Constitutional:   No fever or chills.  ENT:   No sore throat. No rhinorrhea. Cardiovascular:   No chest pain or syncope. Respiratory:   No dyspnea or cough. Gastrointestinal:   Negative for abdominal pain, vomiting and diarrhea.  Musculoskeletal:   positive as above for chronic right shoulder pain All other systems reviewed and are negative except as documented above in ROS and HPI.  ____________________________________________   PHYSICAL EXAM:  VITAL SIGNS: ED Triage Vitals [10/03/17 0127]  Enc Vitals Group     BP 117/74  Pulse Rate 66     Resp 18     Temp 97.9 F (36.6 C)     Temp Source Oral     SpO2 98 %     Weight 170 lb (77.1 kg)     Height  (1.702 m)     Head Circumference      Peak Flow      Pain Score 10     Pain Loc      Pain Edu?      Excl. in GC?     Vital signs reviewed, nursing assessments reviewed.   Constitutional:   Alert and oriented. Well appearing and in no distress. Eyes:   Conjunctivae are normal. EOMI. PERRL. ENT      Head:    Normocephalic and atraumatic.      Nose:   No congestion/rhinnorhea.       Mouth/Throat:   MMM, no pharyngeal erythema. No peritonsillar mass.       Neck:   No meningismus. Full ROM.thyroid nonpalpable Hematological/Lymphatic/Immunilogical:   No cervical lymphadenopathy. Cardiovascular:   RRR. Symmetric bilateral radial and DP pulses.  No murmurs.  Respiratory:   Normal respiratory effort without tachypnea/retractions. Breath sounds are clear and equal bilaterally. No wheezes/rales/rhonchi. Gastrointestinal:   Soft and nontender. Non distended. There is no CVA tenderness.  No rebound, rigidity, or guarding. Genitourinary:   deferred Musculoskeletal:   Normal range of motion in all extremities. No joint effusions.  pain is somewhat reproduced by axial loading of the neck with head rotated, suggestive of cervical radiculopathy Neurologic:   Normal speech and language.  Motor grossly intact. No acute focal neurologic deficits are appreciated.  Skin:    Skin is warm, dry and intact. No rash noted.  No petechiae, purpura, or bullae.  ____________________________________________    LABS (pertinent positives/negatives) (all labs ordered are listed, but only abnormal results are displayed) Labs Reviewed - No data to display ____________________________________________   EKG    ____________________________________________    RADIOLOGY  No results found.  ____________________________________________   PROCEDURES Procedures  ____________________________________________  DIFFERENTIAL DIAGNOSIS   cervical radiculopathy, shoulder impingement, osteoarthritis  CLINICAL IMPRESSION / ASSESSMENT AND PLAN / ED COURSE  Pertinent labs & imaging results that were available during my care of the patient were reviewed by me and considered in my medical decision making (see chart for details).    patient well appearing no acute distress, normal vital signs, reassuring physical exam. Low  suspicion for ACS PE dissection or pneumothorax or pneumonia. I doubt any kind of infectious process such as cellulitis abscess necrotizing fasciitis osteomyelitis or septic arthritis. Patient is undergoing an appropriate outpatient workup for her complaints. I reviewed electronic medical records and see that she is under the care of sports medicine who is referring the patient for an MRI of the shoulder. Counseled her to continue taking the Voltaren and said. She has a history of hepatitis, so I did avoid Tylenol, but I'll prescribe her gabapentin at a low dose for additional pain relief.      ____________________________________________   FINAL CLINICAL IMPRESSION(S) / ED DIAGNOSES    Final diagnoses:  Chronic right shoulder pain     ED Discharge Orders        Ordered    gabapentin (NEURONTIN) 100 MG capsule  3 times daily     10/03/17 0511      Portions of this note were generated with dragon dictation software. Dictation errors may occur despite best attempts at proofreading.  Sharman Cheek, MD 10/03/17 [phone]

## 2017-10-06 ENCOUNTER — Other Ambulatory Visit: Payer: Self-pay | Admitting: Orthopaedic Surgery

## 2017-10-06 DIAGNOSIS — M25511 Pain in right shoulder: Secondary | ICD-10-CM

## 2017-10-06 DIAGNOSIS — M7541 Impingement syndrome of right shoulder: Secondary | ICD-10-CM

## 2017-10-08 ENCOUNTER — Ambulatory Visit
Admission: RE | Admit: 2017-10-08 | Discharge: 2017-10-08 | Disposition: A | Discharge status: Home / Self Care | Payer: BLUE CROSS/BLUE SHIELD | Source: Ambulatory Visit | Attending: Orthopaedic Surgery | Admitting: Orthopaedic Surgery

## 2017-10-08 DIAGNOSIS — M7541 Impingement syndrome of right shoulder: Secondary | ICD-10-CM | POA: Diagnosis present

## 2017-10-08 DIAGNOSIS — M75101 Unspecified rotator cuff tear or rupture of right shoulder, not specified as traumatic: Secondary | ICD-10-CM | POA: Diagnosis not present

## 2017-10-08 DIAGNOSIS — M25511 Pain in right shoulder: Secondary | ICD-10-CM

## 2017-11-05 ENCOUNTER — Emergency Department: Payer: BLUE CROSS/BLUE SHIELD

## 2017-11-05 ENCOUNTER — Emergency Department
Admission: EM | Admit: 2017-11-05 | Discharge: 2017-11-05 | Disposition: A | Discharge status: Home / Self Care | Payer: BLUE CROSS/BLUE SHIELD | Attending: Emergency Medicine | Admitting: Emergency Medicine

## 2017-11-05 ENCOUNTER — Other Ambulatory Visit: Payer: Self-pay

## 2017-11-05 DIAGNOSIS — E876 Hypokalemia: Secondary | ICD-10-CM | POA: Insufficient documentation

## 2017-11-05 DIAGNOSIS — Z79899 Other long term (current) drug therapy: Secondary | ICD-10-CM | POA: Insufficient documentation

## 2017-11-05 DIAGNOSIS — M79605 Pain in left leg: Secondary | ICD-10-CM | POA: Diagnosis present

## 2017-11-05 LAB — CBC WITH DIFFERENTIAL/PLATELET
BASOS ABS: 0 10*3/uL (ref 0–0.1)
BASOS PCT: 1 %
EOS PCT: 0 %
Eosinophils Absolute: 0 10*3/uL (ref 0–0.7)
HCT: 42.1 % (ref 35.0–47.0)
Hemoglobin: 14.9 g/dL (ref 12.0–16.0)
LYMPHS PCT: 27 %
Lymphs Abs: 1.7 10*3/uL (ref 1.0–3.6)
MCH: 32.9 pg (ref 26.0–34.0)
MCHC: 35.4 g/dL (ref 32.0–36.0)
MCV: 92.9 fL (ref 80.0–100.0)
Monocytes Absolute: 0.7 10*3/uL (ref 0.2–0.9)
Monocytes Relative: 10 %
NEUTROS ABS: 4 10*3/uL (ref 1.4–6.5)
Neutrophils Relative %: 62 %
PLATELETS: 112 10*3/uL — AB (ref 150–440)
RBC: 4.54 MIL/uL (ref 3.80–5.20)
RDW: 12.7 % (ref 11.5–14.5)
WBC: 6.4 10*3/uL (ref 3.6–11.0)

## 2017-11-05 LAB — BASIC METABOLIC PANEL
ANION GAP: 9 (ref 5–15)
BUN: 15 mg/dL (ref 6–20)
CO2: 27 mmol/L (ref 22–32)
Calcium: 8.8 mg/dL — ABNORMAL LOW (ref 8.9–10.3)
Chloride: 101 mmol/L (ref 101–111)
Creatinine, Ser: 0.69 mg/dL (ref 0.44–1.00)
GFR calc non Af Amer: >60 mL/min (ref >60)
GLUCOSE: 101 mg/dL — AB (ref 65–99)
POTASSIUM: 3.4 mmol/L — AB (ref 3.5–5.1)
Sodium: 137 mmol/L (ref 135–145)

## 2017-11-05 MED ORDER — POTASSIUM CHLORIDE ER 10 MEQ PO TBCR: 10.0000 meq | EXTENDED_RELEASE_TABLET | Freq: Every day | ORAL | 0 refills | Status: DC

## 2017-11-05 NOTE — Discharge Instructions (Addendum)
Follow-up with your regular doctor in 3 days.  Have your potassium rechecked in 1 week.  Take medication as prescribed.  Return emergency department if worsening.  If you continue to have leg pain in the next next week you will need to have a repeat ultrasound.

## 2017-11-05 NOTE — ED Triage Notes (Signed)
Pt states rotator cuff surgery on Monday. States now c/o of L leg cramping. Called her doctor and they told her to come here for possible blood clot. Both legs warm, no edema noted. No redness noted. States feels cramping pain in L calf.

## 2017-11-05 NOTE — ED Notes (Signed)
Spoke to Dr. Derrill KayGoodman about pt presentation, verbal orders for US Venous L leg but denied need for blood work at this time.

## 2017-11-05 NOTE — ED Provider Notes (Signed)
[facility] Emergency Department Provider Note  ____________________________________________   None    (approximate)  I have reviewed the triage vital signs and the nursing notes.   HISTORY  Chief Complaint Leg Pain    HPI Sherri Wagner is a 62 y.o. female presents emergency department due to left lower leg pain.  [facility].  She was instructed to come here and be ruled out for DVT.  She states that the legs are cramping.  States it is more often when she is standing.  She denies any chest pain or shortness of breath.  History reviewed. No pertinent past medical history.  There are no active problems to display for this patient.   Past Surgical History:  Procedure Laterality Date  . ABDOMINAL HYSTERECTOMY    . ROTATOR CUFF REPAIR      Prior to Admission medications   Medication Sig Start Date End Date Taking? Authorizing Provider  Cholecalciferol (VITAMIN D3) 1000 units CAPS Take 1,000 Units by mouth daily.   Yes [provider]  ferrous sulfate 325 (65 FE) MG tablet Take 325 mg by mouth daily.   Yes [provider]  Multiple Vitamin (MULTIVITAMIN) tablet Take 1 tablet by mouth daily.   Yes [provider]  vitamin B-12 (CYANOCOBALAMIN) 500 MCG tablet Take 500 mcg by mouth daily.   Yes [provider]  alum & mag hydroxide-simeth (MAALOX MAX) 400-400-40 MG/5ML suspension Take 5 mLs by mouth every 6 (six) hours as needed for indigestion. 10/26/16   Nita SickleVeronese, [place], MD  famotidine (PEPCID) 20 MG tablet Take 1 tablet (20 mg total) by mouth 2 (two) times daily. 10/26/16 10/26/17  Nita SickleVeronese, [place], MD  gabapentin (NEURONTIN) 100 MG capsule Take 1 capsule (100 mg total) by mouth 3 (three) times daily for 15 days. 10/03/17 10/18/17  Sharman CheekStafford, [place], MD  HYDROcodone-acetaminophen (NORCO/VICODIN) 5-325 MG tablet Take 1 tablet by mouth every 4 (four) hours as needed for moderate  pain. 02/07/16   Tommi RumpsSummers, Rhonda L, PA-C  nitrofurantoin (MACRODANTIN) 100 MG capsule Take 1 capsule (100 mg total) by mouth 2 (two) times daily. 12/03/15   Sharman CheekStafford, [place], MD  Omega-3 Fatty Acids (FISH OIL) 1000 MG CAPS Take 1,000 mg by mouth 2 (two) times daily.    [provider]  ondansetron (ZOFRAN ODT) 4 MG disintegrating tablet Take 1 tablet (4 mg total) by mouth every 8 (eight) hours as needed for nausea or vomiting. 10/26/16   Don PerkingVeronese, [place], MD  potassium chloride (K-DUR) 10 MEQ tablet Take 1 tablet (10 mEq total) by mouth daily. 11/05/17   Faythe GheeFisher, Susan W, PA-C    Allergies Patient has no known allergies.  History reviewed. No pertinent family history.  Social History Social History   Tobacco Use  . Smoking status: Never Smoker  . Smokeless tobacco: Never Used  Substance Use Topics  . Alcohol use: Yes    Comment: occasionally  . Drug use: No    Review of Systems  Constitutional: No fever/chills Eyes: No visual changes. ENT: No sore throat. Respiratory: Denies cough Genitourinary: Negative for dysuria. Musculoskeletal: Negative for back pain.  Positive for leg cramps Skin: Negative for rash.    ____________________________________________   PHYSICAL EXAM:  VITAL SIGNS: ED Triage Vitals  Enc Vitals Group     BP 11/05/17 1134 130/84     Pulse Rate 11/05/17 1134 66     Resp 11/05/17 1134 16     Temp 11/05/17 1134  99 F (37.2 C)     Temp Source 11/05/17 1134 Oral     SpO2 11/05/17 1134 99 %     Weight 11/05/17 1135 167 lb (75.8 kg)     Height 11/05/17 1135 5\' 7"  (1.702 m)     Head Circumference --      Peak Flow --      Pain Score 11/05/17 1135 9     Pain Loc --      Pain Edu? --      Excl. in GC? --     Constitutional: Alert and oriented. Well appearing and in no acute distress. Eyes: Conjunctivae are normal.  Head: Atraumatic. Nose: No congestion/rhinnorhea. Mouth/Throat: Mucous membranes are moist.   Cardiovascular: Normal rate,  regular rhythm.  Heart sounds are normal Respiratory: Normal respiratory effort.  No retractions, lungs clear to auscultation GU: deferred Musculoskeletal: The right shoulder is in a brace.  The left lower leg is tender along the calf.  She has a negative Homans sign.  She is neurovascularly intact. Neurologic:  Normal speech and language.  Skin:  Skin is warm, dry and intact. No rash noted. Psychiatric: Mood and affect are normal. Speech and behavior are normal.  ____________________________________________   LABS (all labs ordered are listed, but only abnormal results are displayed)  Labs Reviewed  CBC WITH DIFFERENTIAL/PLATELET - Abnormal; Notable for the following components:      Result Value   Platelets 112 (*)    All other components within normal limits  BASIC METABOLIC PANEL - Abnormal; Notable for the following components:   Potassium 3.4 (*)    Glucose, Bld 101 (*)    Calcium 8.8 (*)    All other components within normal limits   ____________________________________________   ____________________________________________  RADIOLOGY  Ultrasound of the left lower leg is negative for DVT  ____________________________________________   PROCEDURES  Procedure(s) performed: No  Procedures    ____________________________________________   INITIAL IMPRESSION / ASSESSMENT AND PLAN / ED COURSE  Pertinent labs & imaging results that were available during my care of the patient were reviewed by me and considered in my medical decision making (see chart for details).  Patient 62 year old female presents emergency department complaining of left leg pain after she had surgery on Monday.  The surgery was done at [facility].  She had rotator cuff surgery.  Her surgeon instructed her to come to the ED to be ruled out for DVT.  Physical exam the patient appears well.  Vitals are normal.  The right shoulder is in a shoulder immobilizer.  The left leg is mildly tender  distally.  Ultrasound of the left lower leg is negative for DVT.  CBC shows low platelets which could be related to her recent surgery.  Her potassium is low at 3.4.  This could attribute to her leg cramps.  Explained all of these findings to the patient and her family.  She is given a prescription for potassium 10 M EQ's per day.  She is to have her potassium level rechecked in 1 week by her regular doctor or her surgeon.  If she is worsening she should return and have a repeat ultrasound done in 1 week.  She states she understands will comply with our instructions.  She was given a copy of all of her results to share with her surgeon and she was discharged in stable condition with her family.     As part of my medical decision making, I reviewed the following data  within the electronic MEDICAL RECORD NUMBER Nursing notes reviewed and incorporated, Labs reviewed CBC has low platelets, metabolic panel has decreased potassium at 3.4, Old chart reviewed, Radiograph reviewed ultrasound of the left leg is negative for DVT, Notes from prior ED visits and [place] Controlled Substance Database  ____________________________________________   FINAL CLINICAL IMPRESSION(S) / ED DIAGNOSES  Final diagnoses:  Left leg pain  Hypokalemia      NEW MEDICATIONS STARTED DURING THIS VISIT:  Discharge Medication List as of 11/05/2017  4:51 PM    START taking these medications   Details  potassium chloride (K-DUR) 10 MEQ tablet Take 1 tablet (10 mEq total) by mouth daily., Starting Wed 11/05/2017, Print         Note:  This document was prepared using Dragon voice recognition software and may include unintentional dictation errors.    Faythe Ghee, PA-C 11/05/17 1757    Sharyn Creamer, MD 11/06/17 [phone]

## 2017-11-05 NOTE — ED Notes (Signed)
Pt taken to POV in wheelchair with family. VSS, NAD. Discharge instructions, RX and follow up discussed. All questions answered.

## 2018-02-16 ENCOUNTER — Other Ambulatory Visit: Payer: Self-pay | Admitting: Family Medicine

## 2018-02-16 DIAGNOSIS — Z1231 Encounter for screening mammogram for malignant neoplasm of breast: Secondary | ICD-10-CM

## 2018-03-03 ENCOUNTER — Ambulatory Visit
Admission: RE | Admit: 2018-03-03 | Discharge: 2018-03-03 | Disposition: A | Discharge status: Home / Self Care | Payer: BLUE CROSS/BLUE SHIELD | Source: Ambulatory Visit | Attending: Family Medicine | Admitting: Family Medicine

## 2018-03-03 DIAGNOSIS — Z1231 Encounter for screening mammogram for malignant neoplasm of breast: Secondary | ICD-10-CM | POA: Diagnosis not present

## 2018-11-16 ENCOUNTER — Emergency Department: Payer: BC Managed Care – PPO

## 2018-11-16 ENCOUNTER — Emergency Department
Admission: EM | Admit: 2018-11-16 | Discharge: 2018-11-16 | Disposition: A | Discharge status: Home / Self Care | Payer: BC Managed Care – PPO | Attending: Student in an Organized Health Care Education/Training Program | Admitting: Student in an Organized Health Care Education/Training Program

## 2018-11-16 ENCOUNTER — Other Ambulatory Visit: Payer: Self-pay

## 2018-11-16 DIAGNOSIS — R112 Nausea with vomiting, unspecified: Secondary | ICD-10-CM | POA: Diagnosis not present

## 2018-11-16 DIAGNOSIS — I10 Essential (primary) hypertension: Secondary | ICD-10-CM | POA: Insufficient documentation

## 2018-11-16 DIAGNOSIS — R1013 Epigastric pain: Secondary | ICD-10-CM | POA: Insufficient documentation

## 2018-11-16 DIAGNOSIS — Z79899 Other long term (current) drug therapy: Secondary | ICD-10-CM | POA: Insufficient documentation

## 2018-11-16 DIAGNOSIS — R1031 Right lower quadrant pain: Secondary | ICD-10-CM | POA: Diagnosis not present

## 2018-11-16 HISTORY — DX: Essential (primary) hypertension: I10

## 2018-11-16 LAB — COMPREHENSIVE METABOLIC PANEL
ALT: 17 U/L (ref 0–44)
AST: 25 U/L (ref 15–41)
Albumin: 4.6 g/dL (ref 3.5–5.0)
Alkaline Phosphatase: 74 U/L (ref 38–126)
Anion gap: 14 (ref 5–15)
BUN: 12 mg/dL (ref 8–23)
CO2: 24 mmol/L (ref 22–32)
Calcium: 9.1 mg/dL (ref 8.9–10.3)
Chloride: 103 mmol/L (ref 98–111)
Creatinine, Ser: 0.67 mg/dL (ref 0.44–1.00)
GFR calc Af Amer: >60 mL/min (ref >60)
GFR calc non Af Amer: >60 mL/min (ref >60)
Glucose, Bld: 137 mg/dL — ABNORMAL HIGH (ref 70–99)
Potassium: 3.7 mmol/L (ref 3.5–5.1)
Sodium: 141 mmol/L (ref 135–145)
Total Bilirubin: 1.1 mg/dL (ref 0.3–1.2)
Total Protein: 8.4 g/dL — ABNORMAL HIGH (ref 6.5–8.1)

## 2018-11-16 LAB — CBC WITH DIFFERENTIAL/PLATELET
Abs Immature Granulocytes: 0.02 10*3/uL (ref 0.00–0.07)
Basophils Absolute: 0 10*3/uL (ref 0.0–0.1)
Basophils Relative: 0 %
Eosinophils Absolute: 0 10*3/uL (ref 0.0–0.5)
Eosinophils Relative: 0 %
HCT: 42.4 % (ref 36.0–46.0)
Hemoglobin: 14.7 g/dL (ref 12.0–15.0)
Immature Granulocytes: 0 %
Lymphocytes Relative: 9 %
Lymphs Abs: 0.8 10*3/uL (ref 0.7–4.0)
MCH: 31.9 pg (ref 26.0–34.0)
MCHC: 34.7 g/dL (ref 30.0–36.0)
MCV: 92 fL (ref 80.0–100.0)
Monocytes Absolute: 0.3 10*3/uL (ref 0.1–1.0)
Monocytes Relative: 3 %
Neutro Abs: 7.7 10*3/uL (ref 1.7–7.7)
Neutrophils Relative %: 88 %
Platelets: 149 10*3/uL — ABNORMAL LOW (ref 150–400)
RBC: 4.61 MIL/uL (ref 3.87–5.11)
RDW: 12.2 % (ref 11.5–15.5)
WBC: 8.9 10*3/uL (ref 4.0–10.5)
nRBC: 0 % (ref 0.0–0.2)

## 2018-11-16 LAB — LIPASE, BLOOD: Lipase: 33 U/L (ref 11–51)

## 2018-11-16 MED ORDER — IOHEXOL 300 MG/ML  SOLN
100.0000 mL | Freq: Once | INTRAMUSCULAR | Status: AC | PRN
  Administered 2018-11-16: 100 mL via INTRAVENOUS

## 2018-11-16 MED ORDER — SODIUM CHLORIDE 0.9 % IV BOLUS
500.0000 mL | Freq: Once | INTRAVENOUS | Status: AC
  Administered 2018-11-16: 500 mL via INTRAVENOUS

## 2018-11-16 MED ORDER — ONDANSETRON HCL 4 MG PO TABS: 4.0000 mg | ORAL_TABLET | Freq: Three times a day (TID) | ORAL | 0 refills | Status: DC | PRN

## 2018-11-16 MED ORDER — DICYCLOMINE HCL 20 MG PO TABS: 20.0000 mg | ORAL_TABLET | Freq: Three times a day (TID) | ORAL | 0 refills | Status: AC | PRN

## 2018-11-16 MED ORDER — PROMETHAZINE HCL 25 MG/ML IJ SOLN
12.5000 mg | Freq: Four times a day (QID) | INTRAMUSCULAR | Status: DC | PRN
  Administered 2018-11-16: 16:00:00 12.5 mg via INTRAVENOUS
  Filled 2018-11-16: qty 1

## 2018-11-16 NOTE — ED Notes (Signed)
Patient transported to X-ray 

## 2018-11-16 NOTE — ED Notes (Signed)
Pt states she understands discharge instructions and has no questions at this time. Pt states unable to sign due to not feeling well. Pt called for ride to transport home.

## 2018-11-16 NOTE — ED Notes (Signed)
RN Heather at bedside, attempted 2X IV start with no success. IV team consult put in for pt.

## 2018-11-16 NOTE — ED Provider Notes (Signed)
[facility] Emergency Department Provider Note    First MD Initiated Contact with Patient 11/16/18 1224     (approximate)  I have reviewed the triage vital signs and the nursing notes.   HISTORY  Chief Complaint Abdominal Pain    HPI Sherri Wagner is a 63 y.o. female  Presents from home to the ER with less than 24 hours of epigastric discomfort as well as right lower quadrant discomfort nausea and vomiting.  Denies any headache.  No measured fevers but did feel chills.  Is status post partial hysterectomy.  Denies having any history of pain like this.  Has not tried anything to help the pain.      Past Medical History:  Diagnosis Date  . Hypertension    Family History  Problem Relation Age of Onset  . Breast cancer Neg Hx    Past Surgical History:  Procedure Laterality Date  . ABDOMINAL HYSTERECTOMY    . BREAST BIOPSY Left 1995  . ROTATOR CUFF REPAIR     There are no active problems to display for this patient.     Prior to Admission medications   Medication Sig Start Date End Date Taking? Authorizing Provider  alum & mag hydroxide-simeth (MAALOX MAX) 400-400-40 MG/5ML suspension Take 5 mLs by mouth every 6 (six) hours as needed for indigestion. 10/26/16   Rudene Re, MD  Cholecalciferol (VITAMIN D3) 1000 units CAPS Take 1,000 Units by mouth daily.    [provider]  famotidine (PEPCID) 20 MG tablet Take 1 tablet (20 mg total) by mouth 2 (two) times daily. 10/26/16 10/26/17  Rudene Re, MD  ferrous sulfate 325 (65 FE) MG tablet Take 325 mg by mouth daily.    [provider]  gabapentin (NEURONTIN) 100 MG capsule Take 1 capsule (100 mg total) by mouth 3 (three) times daily for 15 days. 10/03/17 10/18/17  Carrie Mew, MD  HYDROcodone-acetaminophen (NORCO/VICODIN) 5-325 MG tablet Take 1 tablet by mouth every 4 (four) hours as needed for moderate pain. 02/07/16   Johnn Hai, PA-C  Multiple Vitamin  (MULTIVITAMIN) tablet Take 1 tablet by mouth daily.    [provider]  nitrofurantoin (MACRODANTIN) 100 MG capsule Take 1 capsule (100 mg total) by mouth 2 (two) times daily. 12/03/15   Carrie Mew, MD  Omega-3 Fatty Acids (FISH OIL) 1000 MG CAPS Take 1,000 mg by mouth 2 (two) times daily.    [provider]  ondansetron (ZOFRAN ODT) 4 MG disintegrating tablet Take 1 tablet (4 mg total) by mouth every 8 (eight) hours as needed for nausea or vomiting. 10/26/16   Alfred Levins, [place], MD  potassium chloride (K-DUR) 10 MEQ tablet Take 1 tablet (10 mEq total) by mouth daily. 11/05/17   Fisher, Linden Dolin, PA-C  vitamin B-12 (CYANOCOBALAMIN) 500 MCG tablet Take 500 mcg by mouth daily.    [provider]    Allergies Patient has no known allergies.    Social History Social History   Tobacco Use  . Smoking status: Never Smoker  . Smokeless tobacco: Never Used  Substance Use Topics  . Alcohol use: Yes    Comment: occasionally  . Drug use: No    Review of Systems Patient denies headaches, rhinorrhea, blurry vision, numbness, shortness of breath, chest pain, edema, cough, abdominal pain, nausea, vomiting, diarrhea, dysuria, fevers, rashes or hallucinations unless otherwise stated above in HPI. ____________________________________________   PHYSICAL EXAM:  VITAL SIGNS: Vitals:   11/16/18 1220  BP: (!) 149/99  Pulse:  68  Temp: 97.6 F (36.4 C)  SpO2: 98%    Constitutional: Alert and oriented. Vomiting and appears uncomfortable Eyes: Conjunctivae are normal.  Head: Atraumatic. Nose: No congestion/rhinnorhea. Mouth/Throat: Mucous membranes are moist.   Neck: No stridor. Painless ROM.  Cardiovascular: Normal rate, regular rhythm. Grossly normal heart sounds.  Good peripheral circulation. Respiratory: Normal respiratory effort.  No retractions. Lungs CTAB. Gastrointestinal: Soft and diffusely tender without peritonitis No distention. No abdominal bruits. No  CVA tenderness. Genitourinary:  Musculoskeletal: No lower extremity tenderness nor edema.  No joint effusions. Neurologic:  Normal speech and language. No gross focal neurologic deficits are appreciated. No facial droop Skin:  Skin is warm, dry and intact. No rash noted. Psychiatric: Mood and affect are normal. Speech and behavior are normal.  ____________________________________________   LABS (all labs ordered are listed, but only abnormal results are displayed)  No results found for this or any previous visit (from the past 24 hour(s)). ____________________________________________  EKG My review and personal interpretation at Time: 12:30   Indication:  Nausea and vomiing  Rate: 60  Rhythm: sinus Axis: normal Other: normal intervals, no stemi ____________________________________________  RADIOLOGY  CT abd pending ____________________________________________   PROCEDURES  Procedure(s) performed:  Procedures    Critical Care performed: no ____________________________________________   INITIAL IMPRESSION / ASSESSMENT AND PLAN / ED COURSE  Pertinent labs & imaging results that were available during my care of the patient were reviewed by me and considered in my medical decision making (see chart for details).   DDX: Enteritis, gastritis, pancreatitis, cholelithiasis, cholecystitis, appendicitis, UTI, stone  Sherri Wagner is a 63 y.o. who presents to the ED with symptoms as described above.  Patient afebrile hemodynamically stable but does appear uncomfortable with frequent vomiting is nonbilious nonbloody.  Blood work will be ordered for the but differential.  Will provide IV emetics and IV fluids.  Patient will need CT imaging to further evaluate for the by differential.  Patient will be signed out to oncoming physician pending results.     The patient was evaluated in Emergency Department today for the symptoms described in the history of present illness. He/she  was evaluated in the context of the global COVID-19 pandemic, which necessitated consideration that the patient might be at risk for infection with the SARS-CoV-2 virus that causes COVID-19. Institutional protocols and algorithms that pertain to the evaluation of patients at risk for COVID-19 are in a state of rapid change based on information released by regulatory bodies including the CDC and federal and state organizations. These policies and algorithms were followed during the patient's care in the ED.  As part of my medical decision making, I reviewed the following data within the electronic MEDICAL RECORD NUMBER Nursing notes reviewed and incorporated, Labs reviewed, notes from prior ED visits and [place] Controlled Substance Database   ____________________________________________   FINAL CLINICAL IMPRESSION(S) / ED DIAGNOSES  Final diagnoses:  Epigastric pain  Nausea and vomiting, intractability of vomiting not specified, unspecified vomiting type      NEW MEDICATIONS STARTED DURING THIS VISIT:  New Prescriptions   No medications on file     Note:  This document was prepared using Dragon voice recognition software and may include unintentional dictation errors.    Willy Eddyobinson, Delfin Squillace, MD 11/16/18 [phone]

## 2018-11-16 NOTE — Discharge Instructions (Addendum)
Please seek medical attention for any high fevers, chest pain, shortness of breath, change in behavior, persistent vomiting, bloody stool or any other new or concerning symptoms.  

## 2018-11-16 NOTE — ED Notes (Signed)
IV team at bedside 

## 2018-11-16 NOTE — ED Notes (Signed)
Label maker not working, chart labels sent, lab aware

## 2018-11-16 NOTE — ED Provider Notes (Signed)
CT scan without acute findings in the abdomin. Did have concern for possible pulmonary nodule. CXR was performed which did not show any concerning nodule. Patient did feel better. Will plan on discharging with medication for abdominal pain and nausea. Discussed plan with patient.    Nance Pear, MD 11/16/18 [phone]

## 2018-11-16 NOTE — ED Triage Notes (Signed)
Pt comes via [facility] from home with c/o abdominal pain and vomiting. Pt states this started this am. VSS, NS on monitor.  Pt is alert and oriented X4. Pt moaning at this time. Pt states 10/10.

## 2018-11-16 NOTE — ED Notes (Signed)
Pt back from X-ray.  

## 2019-03-19 ENCOUNTER — Other Ambulatory Visit: Payer: Self-pay

## 2019-03-19 DIAGNOSIS — Z20822 Contact with and (suspected) exposure to covid-19: Secondary | ICD-10-CM

## 2019-03-21 LAB — NOVEL CORONAVIRUS, NAA: SARS-CoV-2, NAA: NOT DETECTED

## 2019-08-30 ENCOUNTER — Ambulatory Visit: Payer: Self-pay | Attending: Internal Medicine

## 2019-08-30 DIAGNOSIS — Z23 Encounter for immunization: Secondary | ICD-10-CM

## 2019-08-30 NOTE — Progress Notes (Signed)
   Covid-19 Vaccination Clinic  Name:  Sherri Wagner    MRN: [id number] DOB: 01-03-1956  08/30/2019  Sherri Wagner was observed post Covid-19 immunization for 15 minutes without incident. She was provided with Vaccine Information Sheet and instruction to access the V-Safe system.   Sherri Wagner was instructed to call 911 with any severe reactions post vaccine: Marland Kitchen Difficulty breathing  . Swelling of face and throat  . A fast heartbeat  . A bad rash all over body  . Dizziness and weakness   Immunizations Administered    Name Date Dose VIS Date Route   Pfizer COVID-19 Vaccine 08/30/2019  4:48 PM 0.3 mL 05/14/2019 Intramuscular   Manufacturer: [employer], [employer]   Lot: [id number]   NDC: [address]-0

## 2019-09-08 ENCOUNTER — Other Ambulatory Visit: Admission: RE | Admit: 2019-09-08 | Payer: Self-pay | Source: Ambulatory Visit

## 2019-09-20 ENCOUNTER — Ambulatory Visit: Payer: Medicaid Other | Attending: Internal Medicine

## 2019-09-20 DIAGNOSIS — Z23 Encounter for immunization: Secondary | ICD-10-CM

## 2019-09-20 NOTE — Progress Notes (Signed)
   Covid-19 Vaccination Clinic  Name:  Jaidon Sponsel    MRN: [id number] DOB: 1956/05/06  09/20/2019  Ms. Easton was observed post Covid-19 immunization for 15 minutes without incident. She was provided with Vaccine Information Sheet and instruction to access the V-Safe system.   Ms. Wroe was instructed to call 911 with any severe reactions post vaccine: Marland Kitchen Difficulty breathing  . Swelling of face and throat  . A fast heartbeat  . A bad rash all over body  . Dizziness and weakness   Immunizations Administered    Name Date Dose VIS Date Route   Pfizer COVID-19 Vaccine 09/20/2019  4:09 PM 0.3 mL 07/28/2018 Intramuscular   Manufacturer: [employer], [employer]   Lot: [id number]   NDC: [address]-7

## 2019-11-16 ENCOUNTER — Other Ambulatory Visit: Payer: Self-pay | Admitting: Physical Medicine & Rehabilitation

## 2019-11-16 DIAGNOSIS — M5412 Radiculopathy, cervical region: Secondary | ICD-10-CM

## 2019-11-29 ENCOUNTER — Ambulatory Visit: Payer: Managed Care, Other (non HMO)

## 2019-12-13 ENCOUNTER — Ambulatory Visit: Payer: Managed Care, Other (non HMO)

## 2020-02-14 ENCOUNTER — Ambulatory Visit: Payer: Managed Care, Other (non HMO) | Attending: Otolaryngology

## 2020-02-14 DIAGNOSIS — G4733 Obstructive sleep apnea (adult) (pediatric): Secondary | ICD-10-CM | POA: Insufficient documentation

## 2020-02-14 DIAGNOSIS — G3184 Mild cognitive impairment, so stated: Secondary | ICD-10-CM | POA: Insufficient documentation

## 2020-02-14 DIAGNOSIS — G4761 Periodic limb movement disorder: Secondary | ICD-10-CM | POA: Insufficient documentation

## 2020-02-15 ENCOUNTER — Other Ambulatory Visit: Payer: Self-pay

## 2020-06-21 IMAGING — CT CT ABDOMEN AND PELVIS WITH CONTRAST
2 of 6 series · 14 of 46 positions shown, 16 images · IV contrast (APPLIED)
Comparison: CT scan dated 09/15/2013
COMPARISON: CT scan dated 09/15/2013

Addendum:
CLINICAL DATA: Epigastric and right lower quadrant abdominal pain
with nausea and vomiting.

EXAM:
CT ABDOMEN AND PELVIS WITH CONTRAST
TECHNIQUE: Multidetector CT imaging of the abdomen and pelvis was performed
using the standard protocol following bolus administration of
intravenous contrast.
CONTRAST:  100mL OMNIPAQUE IOHEXOL 300 MG/ML  SOLN

[Series 2: routine abd/pel with · axial · 0.72mm/px · z∈[-494,-99]mm · 11 of 91 slices shown, 13 images]
[im 6/91  soft-tissue]
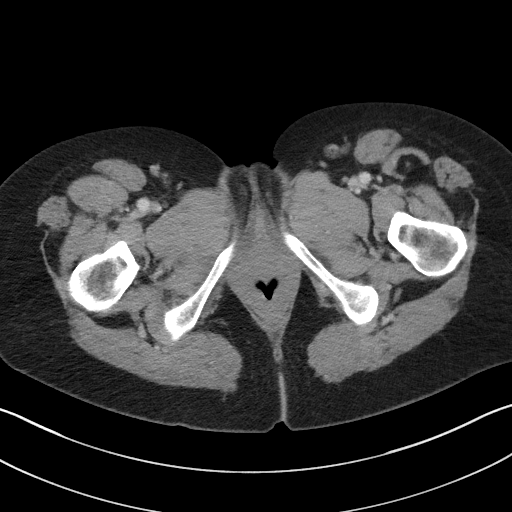
[im 6/91  bone]
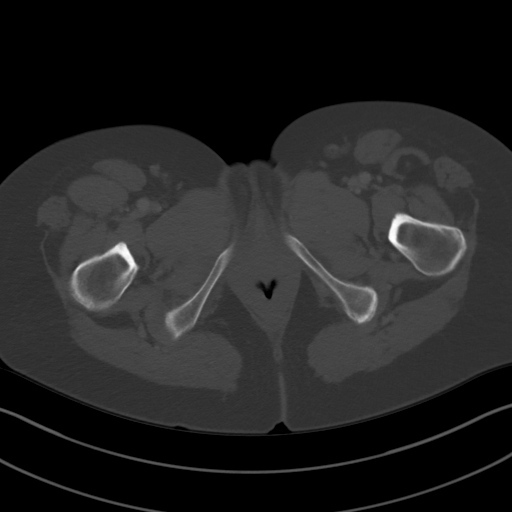
[im 16/91  soft-tissue]
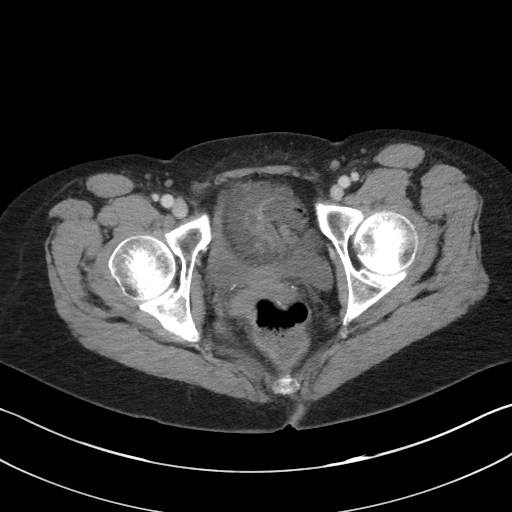
[im 22/91  soft-tissue]
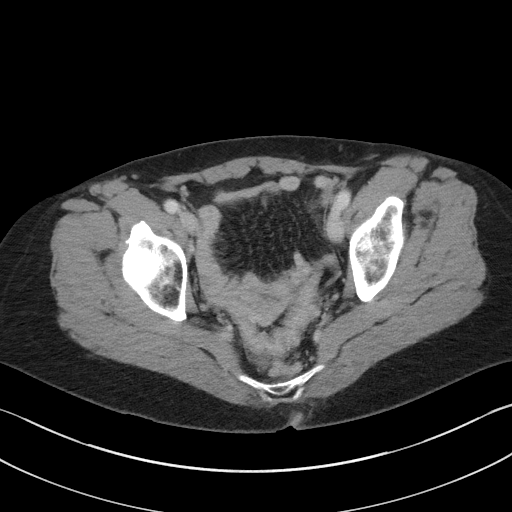
[im 32/91  soft-tissue]
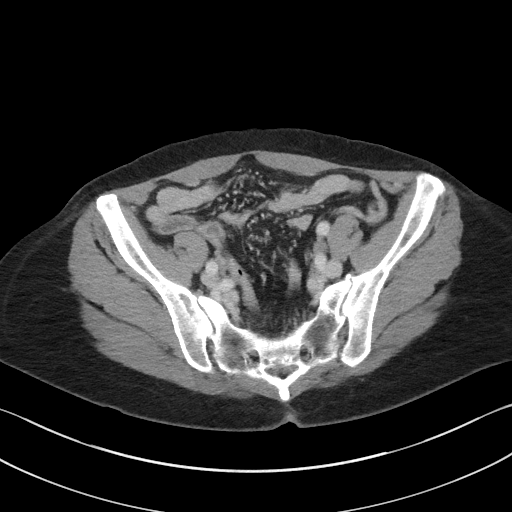
[im 38/91  soft-tissue]
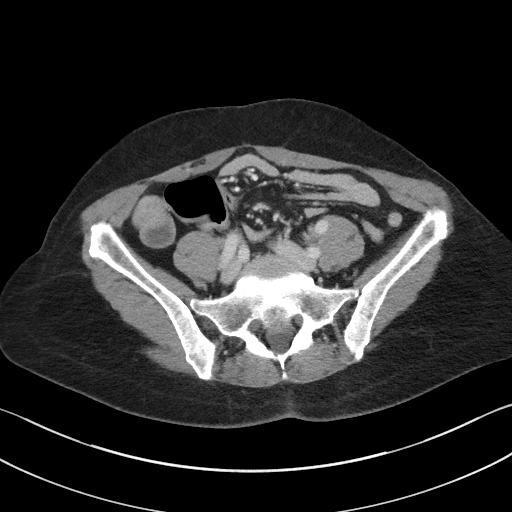
[im 48/91  soft-tissue]
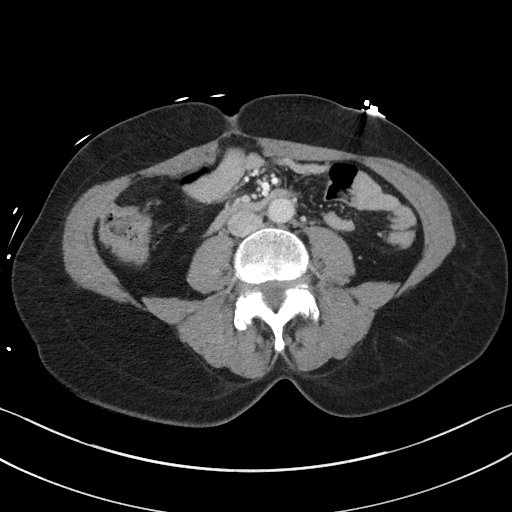
[im 53/91  soft-tissue]
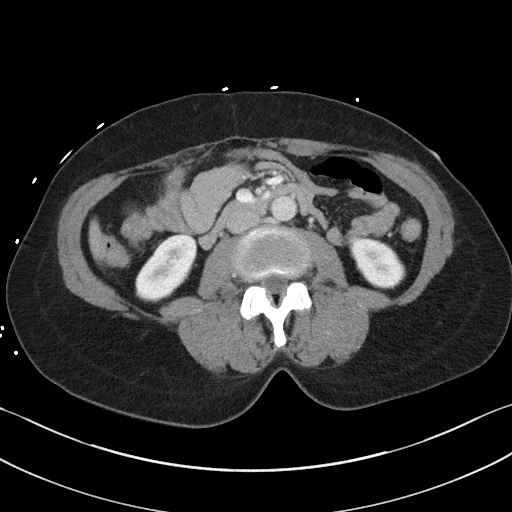
[im 59/91  soft-tissue]
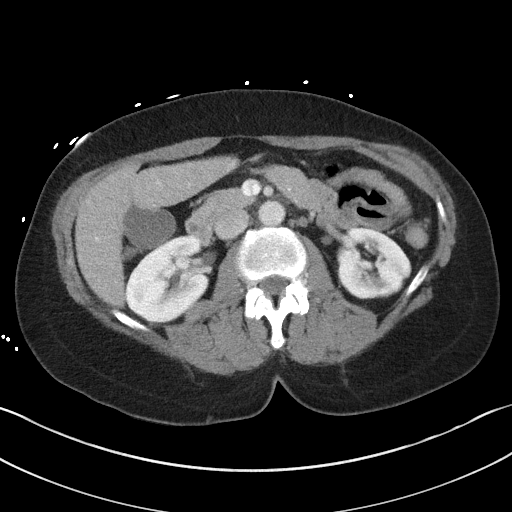
[im 69/91  soft-tissue]
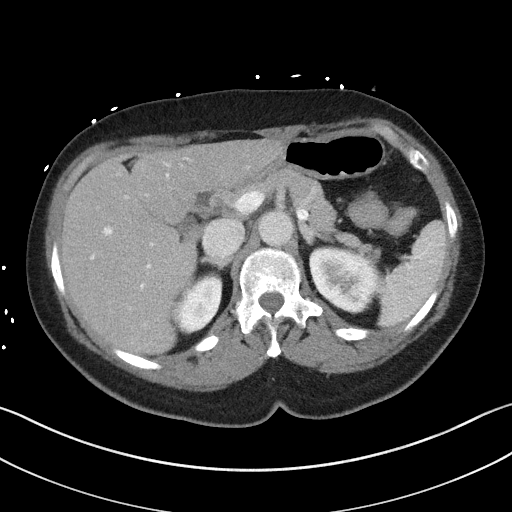
[im 69/91  bone]
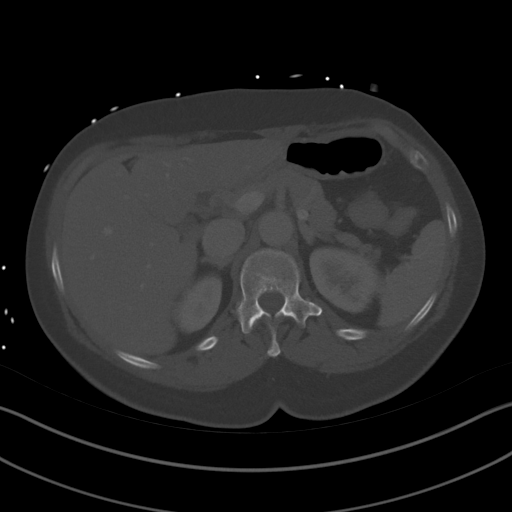
[im 75/91  soft-tissue]
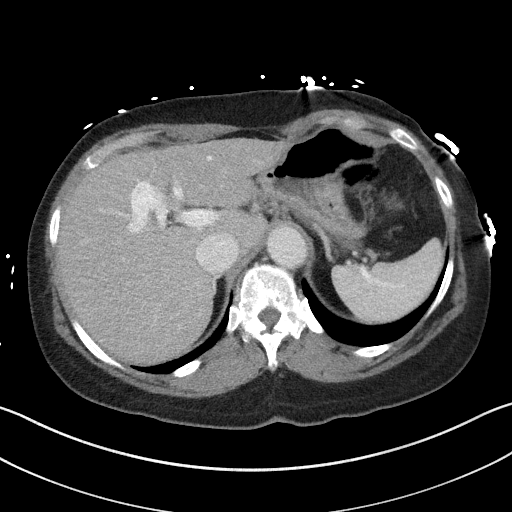
[im 85/91  soft-tissue]
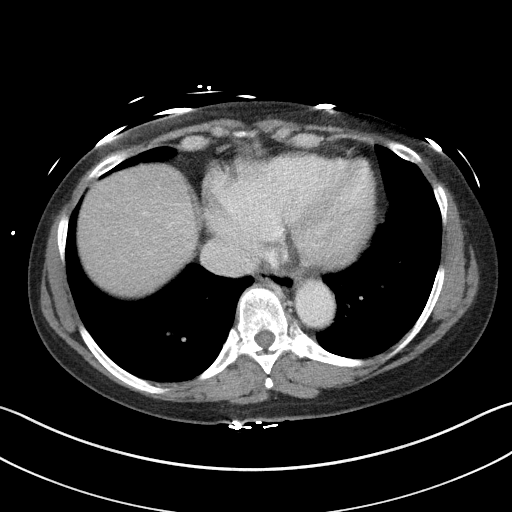

[Series 5: coronal st · coronal · 0.69mm/px · 3 of 84 slices shown]
[im 28/84  soft-tissue]
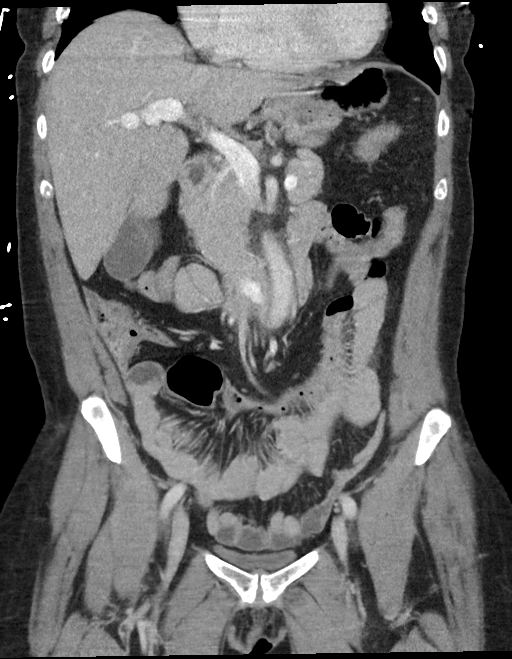
[im 37/84  soft-tissue]
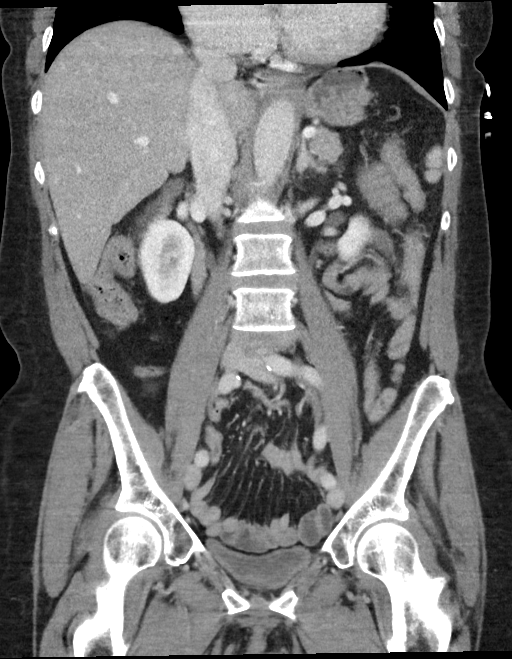
[im 47/84  soft-tissue]
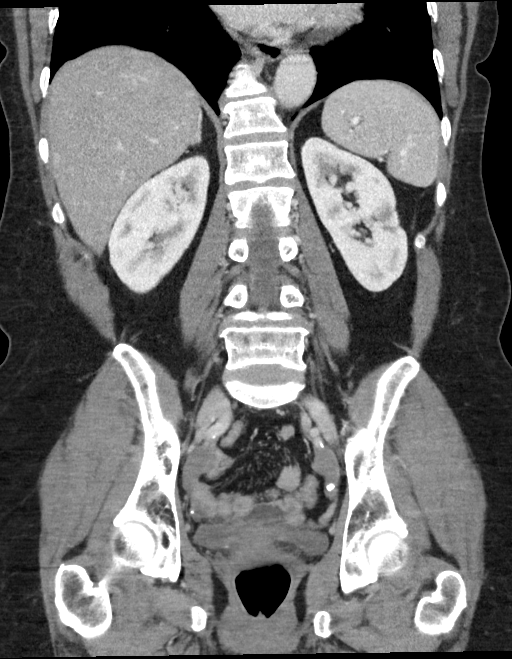

[14 of 46 positions shown; findings below may reference images not displayed]

FINDINGS: Lower chest: There appears to be an 11 mm nodule in the right mid
lung zone on the scout image for the CT scan. This was not present
on chest x-ray dated 07/14/2016. I recommend PA and lateral chest
x-ray and if the nodule persists, CT scan of the chest with contrast
would be indicated. There is mild cardiomegaly. Small hiatal hernia,
unchanged.

Hepatobiliary: There is a small focal area of transient increased
density in the posterior aspect of the dome of the right lobe of the
liver, probably a transient hepatic attenuation difference. Liver
parenchyma is otherwise normal. Biliary tree is normal.

Pancreas: Unremarkable. No pancreatic ductal dilatation or
surrounding inflammatory changes.

Spleen: Normal in size without focal abnormality.

Adrenals/Urinary Tract: Adrenal glands are unremarkable. Kidneys are
normal, without renal calculi, focal lesion, or hydronephrosis.
Bladder is unremarkable.

Stomach/Bowel: Small chronic hiatal hernia. The bowel appears normal
including the terminal ileum and appendix.

Vascular/Lymphatic: Slight atherosclerosis in the origins of the
common iliac arteries. No adenopathy.

Reproductive: Uterus has been removed. Ovaries demonstrate no
significant abnormality. Benign calcification in the small left
ovary.

Other: Tiny amount of free fluid in the pelvis, nonspecific.

Musculoskeletal: No acute abnormalities. Grade 1 spondylolisthesis
at L4-5 secondary to bilateral facet arthritis.
IMPRESSION: 1. Possible 11 mm nodule in the right mid lung. PA and lateral chest
x-ray recommended for further evaluation. If the nodule persists on
chest x-ray, CT scan of the chest with contrast would be indicated.
2. Tiny amount of nonspecific free fluid in the pelvis. Otherwise
benign appearing abdomen and pelvis.
3. Small chronic hiatal hernia.

ADDENDUM:
Critical Value/emergent results were called by telephone at the time
of interpretation on 11/16/2018 at [DATE] to Dr. Nez , who
verbally acknowledged these results.

*** End of Addendum ***
FINDINGS: Lower chest: There appears to be an 11 mm nodule in the right mid
lung zone on the scout image for the CT scan. This was not present
on chest x-ray dated 07/14/2016. I recommend PA and lateral chest
x-ray and if the nodule persists, CT scan of the chest with contrast
would be indicated. There is mild cardiomegaly. Small hiatal hernia,
unchanged.

Hepatobiliary: There is a small focal area of transient increased
density in the posterior aspect of the dome of the right lobe of the
liver, probably a transient hepatic attenuation difference. Liver
parenchyma is otherwise normal. Biliary tree is normal.

Pancreas: Unremarkable. No pancreatic ductal dilatation or
surrounding inflammatory changes.

Spleen: Normal in size without focal abnormality.

Adrenals/Urinary Tract: Adrenal glands are unremarkable. Kidneys are
normal, without renal calculi, focal lesion, or hydronephrosis.
Bladder is unremarkable.

Stomach/Bowel: Small chronic hiatal hernia. The bowel appears normal
including the terminal ileum and appendix.

Vascular/Lymphatic: Slight atherosclerosis in the origins of the
common iliac arteries. No adenopathy.

Reproductive: Uterus has been removed. Ovaries demonstrate no
significant abnormality. Benign calcification in the small left
ovary.

Other: Tiny amount of free fluid in the pelvis, nonspecific.

Musculoskeletal: No acute abnormalities. Grade 1 spondylolisthesis
at L4-5 secondary to bilateral facet arthritis.
IMPRESSION: 1. Possible 11 mm nodule in the right mid lung. PA and lateral chest
x-ray recommended for further evaluation. If the nodule persists on
chest x-ray, CT scan of the chest with contrast would be indicated.
2. Tiny amount of nonspecific free fluid in the pelvis. Otherwise
benign appearing abdomen and pelvis.
3. Small chronic hiatal hernia.

## 2021-02-28 ENCOUNTER — Other Ambulatory Visit: Payer: Self-pay | Admitting: Physician Assistant

## 2021-02-28 DIAGNOSIS — Z1231 Encounter for screening mammogram for malignant neoplasm of breast: Secondary | ICD-10-CM

## 2021-03-14 ENCOUNTER — Ambulatory Visit
Admission: RE | Admit: 2021-03-14 | Discharge: 2021-03-14 | Disposition: A | Discharge status: Home / Self Care | Payer: Medicare HMO | Source: Ambulatory Visit | Attending: Physician Assistant | Admitting: Physician Assistant

## 2021-03-14 ENCOUNTER — Other Ambulatory Visit: Payer: Self-pay

## 2021-03-14 DIAGNOSIS — Z1231 Encounter for screening mammogram for malignant neoplasm of breast: Secondary | ICD-10-CM | POA: Diagnosis present

## 2022-05-28 ENCOUNTER — Other Ambulatory Visit: Payer: Self-pay | Admitting: Physician Assistant

## 2022-05-28 DIAGNOSIS — S52521D Torus fracture of lower end of right radius, subsequent encounter for fracture with routine healing: Secondary | ICD-10-CM

## 2022-05-30 ENCOUNTER — Ambulatory Visit
Admission: RE | Admit: 2022-05-30 | Discharge: 2022-05-30 | Disposition: A | Discharge status: Home / Self Care | Payer: Medicare HMO | Source: Ambulatory Visit | Attending: Physician Assistant | Admitting: Physician Assistant

## 2022-05-30 DIAGNOSIS — S52521D Torus fracture of lower end of right radius, subsequent encounter for fracture with routine healing: Secondary | ICD-10-CM | POA: Diagnosis present

## 2022-07-18 ENCOUNTER — Ambulatory Visit: Payer: Medicare HMO | Admitting: Dermatology

## 2022-08-01 ENCOUNTER — Ambulatory Visit (INDEPENDENT_AMBULATORY_CARE_PROVIDER_SITE_OTHER): Payer: Medicare PPO | Admitting: Dermatology

## 2022-08-01 DIAGNOSIS — L84 Corns and callosities: Secondary | ICD-10-CM | POA: Diagnosis not present

## 2022-08-01 DIAGNOSIS — Z79899 Other long term (current) drug therapy: Secondary | ICD-10-CM

## 2022-08-01 DIAGNOSIS — M2042 Other hammer toe(s) (acquired), left foot: Secondary | ICD-10-CM | POA: Diagnosis not present

## 2022-08-01 DIAGNOSIS — M2041 Other hammer toe(s) (acquired), right foot: Secondary | ICD-10-CM

## 2022-08-01 MED ORDER — UREA 40 % EX LOTN: TOPICAL_LOTION | CUTANEOUS | 2 refills | Status: DC

## 2022-08-01 NOTE — Patient Instructions (Addendum)
Due to recent changes in healthcare laws, you may see results of your pathology and/or laboratory studies on MyChart before the doctors have had a chance to review them. We understand that in some cases there may be results that are confusing or concerning to you. Please understand that not all results are received at the same time and often the doctors may need to interpret multiple results in order to provide you with the best plan of care or course of treatment. Therefore, we ask that you please give us 2 business days to thoroughly review all your results before contacting the office for clarification. Should we see a critical lab result, you will be contacted sooner.   If You Need Anything After Your Visit  If you have any questions or concerns for your doctor, please call our main line at [phone] and press option 4 to reach your doctor's medical assistant. If no one answers, please leave a voicemail as directed and we will return your call as soon as possible. Messages left after 4 pm will be answered the following business day.   You may also send us a message via MyChart. We typically respond to MyChart messages within 1-2 business days.  For prescription refills, please ask your pharmacy to contact our office. Our fax number is [phone].  If you have an urgent issue when the clinic is closed that cannot wait until the next business day, you can page your doctor at the number below.    Please note that while we do our best to be available for urgent issues outside of office hours, we are not available 24/7.   If you have an urgent issue and are unable to reach us, you may choose to seek medical care at your doctor's office, retail clinic, urgent care center, or emergency room.  If you have a medical emergency, please immediately call 911 or go to the emergency department.  Pager Numbers  - Dr. Kowalski: [phone]  - Dr. Moye: [phone]  - Dr. Stewart:  [phone]  In the event of inclement weather, please call our main line at [phone] for an update on the status of any delays or closures.  Dermatology Medication Tips: Please keep the boxes that topical medications come in in order to help keep track of the instructions about where and how to use these. Pharmacies typically print the medication instructions only on the boxes and not directly on the medication tubes.   If your medication is too expensive, please contact our office at [phone] option 4 or send us a message through MyChart.   We are unable to tell what your co-pay for medications will be in advance as this is different depending on your insurance coverage. However, we may be able to find a substitute medication at lower cost or fill out paperwork to get insurance to cover a needed medication.   If a prior authorization is required to get your medication covered by your insurance company, please allow us 1-2 business days to complete this process.  Drug prices often vary depending on where the prescription is filled and some pharmacies may offer cheaper prices.  The website www.goodrx.com contains coupons for medications through different pharmacies. The prices here do not account for what the cost may be with help from insurance (it may be cheaper with your insurance), but the website can give you the price if you did not use any insurance.  - You can print the associated coupon and take it with   your prescription to the pharmacy.  - You may also stop by our office during regular business hours and pick up a GoodRx coupon card.  - If you need your prescription sent electronically to a different pharmacy, notify our office through [facility] MyChart or by phone at [phone] option 4.     Si Usted Necesita Algo Despus de Su Visita  Tambin puede enviarnos un mensaje a travs de MyChart. Por lo general respondemos a los mensajes de MyChart en el transcurso de 1 a 2  das hbiles.  Para renovar recetas, por favor pida a su farmacia que se ponga en contacto con nuestra oficina. Nuestro nmero de fax es el [phone].  Si tiene un asunto urgente cuando la clnica est cerrada y que no puede esperar hasta el siguiente da hbil, puede llamar/localizar a su doctor(a) al nmero que aparece a continuacin.   Por favor, tenga en cuenta que aunque hacemos todo lo posible para estar disponibles para asuntos urgentes fuera del horario de oficina, no estamos disponibles las 24 horas del da, los 7 das de la semana.   Si tiene un problema urgente y no puede comunicarse con nosotros, puede optar por buscar atencin mdica  en el consultorio de su doctor(a), en una clnica privada, en un centro de atencin urgente o en una sala de emergencias.  Si tiene una emergencia mdica, por favor llame inmediatamente al 911 o vaya a la sala de emergencias.  Nmeros de bper  - Dr. Kowalski: [phone]  - Dra. Moye: [phone]  - Dra. Stewart: [phone]  En caso de inclemencias del tiempo, por favor llame a nuestra lnea principal al [phone] para una actualizacin sobre el estado de cualquier retraso o cierre.  Consejos para la medicacin en dermatologa: Por favor, guarde las cajas en las que vienen los medicamentos de uso tpico para ayudarle a seguir las instrucciones sobre dnde y cmo usarlos. Las farmacias generalmente imprimen las instrucciones del medicamento slo en las cajas y no directamente en los tubos del medicamento.   Si su medicamento es muy caro, por favor, pngase en contacto con nuestra oficina llamando al [phone] y presione la opcin 4 o envenos un mensaje a travs de MyChart.   No podemos decirle cul ser su copago por los medicamentos por adelantado ya que esto es diferente dependiendo de la cobertura de su seguro. Sin embargo, es posible que podamos encontrar un medicamento sustituto a menor costo o llenar un formulario para que el  seguro cubra el medicamento que se considera necesario.   Si se requiere una autorizacin previa para que su compaa de seguros cubra su medicamento, por favor permtanos de 1 a 2 das hbiles para completar este proceso.  Los precios de los medicamentos varan con frecuencia dependiendo del lugar de dnde se surte la receta y alguna farmacias pueden ofrecer precios ms baratos.  El sitio web www.goodrx.com tiene cupones para medicamentos de diferentes farmacias. Los precios aqu no tienen en cuenta lo que podra costar con la ayuda del seguro (puede ser ms barato con su seguro), pero el sitio web puede darle el precio si no utiliz ningn seguro.  - Puede imprimir el cupn correspondiente y llevarlo con su receta a la farmacia.  - Tambin puede pasar por nuestra oficina durante el horario de atencin regular y recoger una tarjeta de cupones de GoodRx.  - Si necesita que su receta se enve electrnicamente a una farmacia diferente, informe a nuestra oficina a travs de MyChart de [facility]   o por telfono llamando al [phone] y presione la opcin 4.  

## 2022-08-01 NOTE — Progress Notes (Signed)
   New Patient Visit  Subjective  Sherri Wagner is a 67 y.o. female who presents for the following: Skin Problem (Patient c/o callus on her hands and feet for several years, treating with otc Cocoa butter ).  The following portions of the chart were reviewed this encounter and updated as appropriate:   Tobacco  Allergies  Meds  Problems  Med Hx  Surg Hx  Fam Hx     Review of Systems:  No other skin or systemic complaints except as noted in HPI or Assessment and Plan.  Objective  Well appearing patient in no apparent distress; mood and affect are within normal limits.  A focused examination was performed including feet. Relevant physical exam findings are noted in the Assessment and Plan.  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 Condition is symptomatic / bothersome to patient. Not to goal.   Start otc Urea 40% cream apply to affected area of callused feet daily   May consult podiatry if this is not helpful or if she wants aggressive treatment.  Related Medications Urea 40 % LOTN Apply to callused area feet once a day.  Hammer toes of both feet toes Recommend consult with Podiatrist  Related Procedures Ambulatory referral to Podiatry  Return if symptoms worsen or fail to improve.  IMarye Round, CMA, am acting as scribe for Sarina Ser, MD .  Documentation: I have reviewed the above documentation for accuracy and completeness, and I agree with the above.  Sarina Ser, MD

## 2022-08-09 ENCOUNTER — Encounter: Payer: Self-pay | Admitting: Dermatology

## 2022-08-13 ENCOUNTER — Telehealth: Payer: Self-pay

## 2022-08-13 NOTE — Telephone Encounter (Signed)
[facility] has tried to contact this patient 3 times to scheduled this referral we sent,  no answer and the patient have not called [facility] back.     I tried to call this patient today, LMOVM please contact [facility]

## 2023-02-01 ENCOUNTER — Other Ambulatory Visit: Payer: Self-pay

## 2023-02-01 ENCOUNTER — Observation Stay
Admission: EM | Admit: 2023-02-01 | Discharge: 2023-02-03 | Disposition: A | Discharge status: Home / Self Care | Payer: Medicare HMO | Attending: Internal Medicine | Admitting: Internal Medicine

## 2023-02-01 ENCOUNTER — Emergency Department: Payer: Medicare HMO

## 2023-02-01 ENCOUNTER — Encounter: Payer: Self-pay | Admitting: Emergency Medicine

## 2023-02-01 DIAGNOSIS — I10 Essential (primary) hypertension: Secondary | ICD-10-CM | POA: Insufficient documentation

## 2023-02-01 DIAGNOSIS — F1010 Alcohol abuse, uncomplicated: Secondary | ICD-10-CM | POA: Diagnosis present

## 2023-02-01 DIAGNOSIS — Z79899 Other long term (current) drug therapy: Secondary | ICD-10-CM | POA: Insufficient documentation

## 2023-02-01 DIAGNOSIS — I4581 Long QT syndrome: Secondary | ICD-10-CM | POA: Diagnosis not present

## 2023-02-01 DIAGNOSIS — D696 Thrombocytopenia, unspecified: Secondary | ICD-10-CM | POA: Insufficient documentation

## 2023-02-01 DIAGNOSIS — R9431 Abnormal electrocardiogram [ECG] [EKG]: Secondary | ICD-10-CM | POA: Diagnosis present

## 2023-02-01 DIAGNOSIS — E8729 Other acidosis: Secondary | ICD-10-CM | POA: Diagnosis present

## 2023-02-01 DIAGNOSIS — R112 Nausea with vomiting, unspecified: Principal | ICD-10-CM | POA: Insufficient documentation

## 2023-02-01 DIAGNOSIS — R519 Headache, unspecified: Secondary | ICD-10-CM | POA: Diagnosis not present

## 2023-02-01 DIAGNOSIS — E876 Hypokalemia: Secondary | ICD-10-CM | POA: Insufficient documentation

## 2023-02-01 DIAGNOSIS — R1084 Generalized abdominal pain: Secondary | ICD-10-CM

## 2023-02-01 DIAGNOSIS — G4733 Obstructive sleep apnea (adult) (pediatric): Secondary | ICD-10-CM | POA: Diagnosis present

## 2023-02-01 DIAGNOSIS — R109 Unspecified abdominal pain: Secondary | ICD-10-CM | POA: Diagnosis present

## 2023-02-01 DIAGNOSIS — E872 Acidosis, unspecified: Secondary | ICD-10-CM | POA: Diagnosis not present

## 2023-02-01 DIAGNOSIS — E1110 Type 2 diabetes mellitus with ketoacidosis without coma: Secondary | ICD-10-CM | POA: Diagnosis present

## 2023-02-01 LAB — URINALYSIS, ROUTINE W REFLEX MICROSCOPIC
Bilirubin Urine: NEGATIVE
Glucose, UA: NEGATIVE mg/dL
Hgb urine dipstick: NEGATIVE
Ketones, ur: 15 mg/dL — AB
Leukocytes,Ua: NEGATIVE
Nitrite: NEGATIVE
Protein, ur: NEGATIVE mg/dL
Specific Gravity, Urine: 1.02 (ref 1.005–1.030)
pH: 8.5 — ABNORMAL HIGH (ref 5.0–8.0)

## 2023-02-01 LAB — BLOOD GAS, VENOUS
Acid-Base Excess: 5 mmol/L — ABNORMAL HIGH (ref 0.0–2.0)
Bicarbonate: 29.9 mmol/L — ABNORMAL HIGH (ref 20.0–28.0)
O2 Saturation: 52.9 %
Patient temperature: 37
pCO2, Ven: 44 mmHg (ref 44–60)
pH, Ven: 7.44 — ABNORMAL HIGH (ref 7.25–7.43)
pO2, Ven: 33 mmHg (ref 32–45)

## 2023-02-01 LAB — BETA-HYDROXYBUTYRIC ACID: Beta-Hydroxybutyric Acid: 0.37 mmol/L — ABNORMAL HIGH (ref 0.05–0.27)

## 2023-02-01 LAB — COMPREHENSIVE METABOLIC PANEL
ALT: 19 U/L (ref 0–44)
AST: 41 U/L (ref 15–41)
Albumin: 4.8 g/dL (ref 3.5–5.0)
Alkaline Phosphatase: 43 U/L (ref 38–126)
Anion gap: 18 — ABNORMAL HIGH (ref 5–15)
BUN: 26 mg/dL — ABNORMAL HIGH (ref 8–23)
CO2: 21 mmol/L — ABNORMAL LOW (ref 22–32)
Calcium: 9.9 mg/dL (ref 8.9–10.3)
Chloride: 98 mmol/L (ref 98–111)
Creatinine, Ser: 0.83 mg/dL (ref 0.44–1.00)
GFR, Estimated: >60 mL/min (ref >60)
Glucose, Bld: 180 mg/dL — ABNORMAL HIGH (ref 70–99)
Potassium: 3.3 mmol/L — ABNORMAL LOW (ref 3.5–5.1)
Sodium: 137 mmol/L (ref 135–145)
Total Bilirubin: 1.4 mg/dL — ABNORMAL HIGH (ref 0.3–1.2)
Total Protein: 9.2 g/dL — ABNORMAL HIGH (ref 6.5–8.1)

## 2023-02-01 LAB — CBC
HCT: 40.6 % (ref 36.0–46.0)
Hemoglobin: 14.2 g/dL (ref 12.0–15.0)
MCH: 33.2 pg (ref 26.0–34.0)
MCHC: 35 g/dL (ref 30.0–36.0)
MCV: 94.9 fL (ref 80.0–100.0)
Platelets: 149 10*3/uL — ABNORMAL LOW (ref 150–400)
RBC: 4.28 MIL/uL (ref 3.87–5.11)
RDW: 11.7 % (ref 11.5–15.5)
WBC: 7.8 10*3/uL (ref 4.0–10.5)
nRBC: 0 % (ref 0.0–0.2)

## 2023-02-01 LAB — LIPASE, BLOOD: Lipase: 64 U/L — ABNORMAL HIGH (ref 11–51)

## 2023-02-01 MED ORDER — SODIUM CHLORIDE 0.9 % IV SOLN
12.5000 mg | Freq: Once | INTRAVENOUS | Status: AC
  Administered 2023-02-01: 12.5 mg via INTRAVENOUS
  Filled 2023-02-01: qty 12.5

## 2023-02-01 MED ORDER — PANTOPRAZOLE SODIUM 40 MG IV SOLR
40.0000 mg | Freq: Once | INTRAVENOUS | Status: AC
  Administered 2023-02-01: 40 mg via INTRAVENOUS
  Filled 2023-02-01: qty 10

## 2023-02-01 MED ORDER — HALOPERIDOL LACTATE 5 MG/ML IJ SOLN
3.0000 mg | Freq: Once | INTRAMUSCULAR | Status: AC
  Administered 2023-02-01: 3 mg via INTRAVENOUS
  Filled 2023-02-01: qty 1

## 2023-02-01 MED ORDER — MAGNESIUM SULFATE 2 GM/50ML IV SOLN
2.0000 g | INTRAVENOUS | Status: AC
  Administered 2023-02-01: 2 g via INTRAVENOUS
  Filled 2023-02-01: qty 50

## 2023-02-01 MED ORDER — DEXTROSE 5 % IN LACTATED RINGERS IV BOLUS
1000.0000 mL | Freq: Once | INTRAVENOUS | Status: AC
  Administered 2023-02-01: 1000 mL via INTRAVENOUS
  Filled 2023-02-01: qty 1000

## 2023-02-01 MED ORDER — ONDANSETRON HCL 4 MG/2ML IJ SOLN
4.0000 mg | Freq: Once | INTRAMUSCULAR | Status: AC
  Administered 2023-02-01: 4 mg via INTRAVENOUS
  Filled 2023-02-01: qty 2

## 2023-02-01 MED ORDER — KETOROLAC TROMETHAMINE 15 MG/ML IJ SOLN
15.0000 mg | Freq: Once | INTRAMUSCULAR | Status: AC
  Administered 2023-02-01: 15 mg via INTRAVENOUS
  Filled 2023-02-01: qty 1

## 2023-02-01 MED ORDER — SODIUM CHLORIDE 0.9 % IV BOLUS
1000.0000 mL | Freq: Once | INTRAVENOUS | Status: AC
  Administered 2023-02-01: 1000 mL via INTRAVENOUS

## 2023-02-01 MED ORDER — INSULIN ASPART 100 UNIT/ML IJ SOLN: 0.0000 [IU] | Freq: Three times a day (TID) | INTRAMUSCULAR | Status: DC

## 2023-02-01 MED ORDER — IOHEXOL 300 MG/ML  SOLN
100.0000 mL | Freq: Once | INTRAMUSCULAR | Status: AC | PRN
  Administered 2023-02-01: 100 mL via INTRAVENOUS

## 2023-02-01 MED ORDER — LACTATED RINGERS IV BOLUS
1000.0000 mL | Freq: Once | INTRAVENOUS | Status: AC
  Administered 2023-02-01: 1000 mL via INTRAVENOUS

## 2023-02-01 MED ORDER — ONDANSETRON HCL 4 MG/2ML IJ SOLN
4.0000 mg | Freq: Once | INTRAMUSCULAR | Status: AC | PRN
  Administered 2023-02-01: 4 mg via INTRAVENOUS
  Filled 2023-02-01: qty 2

## 2023-02-01 NOTE — ED Notes (Signed)
First Nurse Note: Pt to ED via [facility] from home for abdominal pain and vomiting. 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 Patient has been stable with EMS. Pt was able to move from the stretcher to the wheelchair without difficulty or assistance.

## 2023-02-01 NOTE — ED Notes (Signed)
Pt got herself out of the wheelchair and placed herself in the floor.

## 2023-02-01 NOTE — ED Provider Notes (Signed)
Assumed patient care from Sherri Flavors, MD.  Upon recheck, patient seemed very drowsy, likely secondary to medications administered as antiemetics.  CT abdomen pelvis was unremarkable.  Patient continued to have vomiting throughout ED course and patient was admitted to the hospitalist service under the care of Dr. Allena Katz.   Pia Mau [place], PA-C 02/01/23 2254    Sharman Cheek, MD 02/04/23 [phone]

## 2023-02-01 NOTE — ED Provider Notes (Signed)
[facility] Provider Note    Event Date/Time   First MD Initiated Contact with Patient 02/01/23 1403     (approximate)   History   Chief Complaint: Abdominal Pain   HPI  Sherri Wagner is a 67 y.o. female with a past history of hypertension who comes ED complaining of upper abdominal pain with nausea and vomiting that started this morning at about 11:00 AM, with dry heaving.  No fever, no chest pain or shortness of breath.  Patient was in usual state of health prior to onset.     Physical Exam   Triage Vital Signs: ED Triage Vitals  Encounter Vitals Group     BP --      Systolic BP Percentile --      Diastolic BP Percentile --      Pulse Rate 02/01/23 1235 76     Resp --      Temp --      Temp Source 02/01/23 1235 Oral     SpO2 02/01/23 1235 99 %     Weight 02/01/23 1238 170 lb (77.1 kg)     Height 02/01/23 1238 5\' 7"  (1.702 m)     Head Circumference --      Peak Flow --      Pain Score 02/01/23 1238 10     Pain Loc --      Pain Education --      Exclude from Growth Chart --     Most recent vital signs: Vitals:   02/01/23 1235 02/01/23 1437  BP:  128/85  Pulse: 76   Resp:  18  Temp:  97.8 F (36.6 C)  SpO2: 99%     General: Awake, no distress.  CV:  Good peripheral perfusion.  Regular rate rhythm Resp:  Normal effort.  Clear to auscultation bilaterally Abd:  No distention.  Soft with mild right upper quadrant tenderness    ED Results / Procedures / Treatments   Labs (all labs ordered are listed, but only abnormal results are displayed) Labs Reviewed  LIPASE, BLOOD - Abnormal; Notable for the following components:      Result Value   Lipase 64 (*)    All other components within normal limits  COMPREHENSIVE METABOLIC PANEL - Abnormal; Notable for the following components:   Potassium 3.3 (*)    CO2 21 (*)    Glucose, Bld 180 (*)    BUN 26 (*)    Total Protein 9.2 (*)    Total Bilirubin 1.4 (*)    Anion gap 18 (*)     All other components within normal limits  CBC - Abnormal; Notable for the following components:   Platelets 149 (*)    All other components within normal limits  URINALYSIS, ROUTINE W REFLEX MICROSCOPIC - Abnormal; Notable for the following components:   Color, Urine YELLOW (*)    APPearance CLEAR (*)    pH 8.5 (*)    Ketones, ur 15 (*)    Bacteria, UA RARE (*)    All other components within normal limits     EKG Interpreted by me Sinus rhythm rate of 75.  Normal axis.  Prolonged QTc of 636 ms.  Normal QRS ST segments and T waves.   RADIOLOGY Ultrasound right upper quadrant interpreted by me, no signs of gallstones or cholecystitis.  Radiology report reviewed   PROCEDURES:  Procedures   MEDICATIONS ORDERED IN ED: Medications  magnesium sulfate IVPB 2 g 50 mL (2 g  Intravenous New Bag/Given 02/01/23 1721)  iohexol (OMNIPAQUE) 300 MG/ML solution 100 mL (has no administration in time range)  ondansetron (ZOFRAN) injection 4 mg (4 mg Intravenous Given 02/01/23 1251)  sodium chloride 0.9 % bolus 1,000 mL (0 mLs Intravenous Stopped 02/01/23 1720)  ketorolac (TORADOL) 15 MG/ML injection 15 mg (15 mg Intravenous Given 02/01/23 1430)  pantoprazole (PROTONIX) injection 40 mg (40 mg Intravenous Given 02/01/23 1430)  haloperidol lactate (HALDOL) injection 3 mg (3 mg Intravenous Given 02/01/23 1531)  lactated ringers bolus 1,000 mL (1,000 mLs Intravenous New Bag/Given 02/01/23 1720)     IMPRESSION / MDM / ASSESSMENT AND PLAN / ED COURSE  I reviewed the triage vital signs and the nursing notes.  DDx: Cholecystitis, pancreatitis, gastritis, viral illness, dehydration, renal colic  Patient's presentation is most consistent with acute presentation with potential threat to life or bodily function.  Patient presents with abdominal pain and vomiting, found to have some right upper quadrant tenderness on exam.  Will obtain ultrasound.  Labs are reassuring.   Clinical Course as of 02/01/23  1837  Sat Feb 01, 2023  1644 Pt reports feeling better. Repeat abd exam is soft, nt. However, when pt attempted to get up, she started vomiting again. Will continue supportive care [PS]  1836 Patient still with nausea and vomiting, preventing CT scan right now.  Repeat EKG shows QTc is now 460 ms.  Will give Phenergan. [PS]    Clinical Course User Index [PS] Sharman Cheek, MD     FINAL CLINICAL IMPRESSION(S) / ED DIAGNOSES   Final diagnoses:  Generalized abdominal pain  Nausea and vomiting, unspecified vomiting type     Rx / DC Orders   ED Discharge Orders     None        Note:  This document was prepared using Dragon voice recognition software and may include unintentional dictation errors.   Sharman Cheek, MD 02/01/23 [phone]

## 2023-02-01 NOTE — ED Triage Notes (Signed)
Pt in via [facility] from home.  Patient reports sudden onset abdominal pain w/ N/V about an hour ago, patient dry heaving and spitting up phlegm in triage.  Patient gives minimal responses in triage.  Cousin is with patient but is a poor historian.    Patient shivering, moaning, groaning in triage.

## 2023-02-01 NOTE — ED Notes (Signed)
This RN notified from Diplomatic Services operational officer at 2327 that daughter Hillary Bow had called & wanted update on patient without a return call prior to this RN assuming care.  I phoned daughter at [phone] to update on pt's status after obtaining verbal consent from patient. Pleasant phone conversation; daughter would like updates as to when & where mother is being admitted.

## 2023-02-02 ENCOUNTER — Observation Stay: Payer: Medicare HMO

## 2023-02-02 DIAGNOSIS — R1114 Bilious vomiting: Secondary | ICD-10-CM | POA: Diagnosis not present

## 2023-02-02 DIAGNOSIS — R109 Unspecified abdominal pain: Secondary | ICD-10-CM | POA: Diagnosis present

## 2023-02-02 DIAGNOSIS — R9431 Abnormal electrocardiogram [ECG] [EKG]: Secondary | ICD-10-CM | POA: Diagnosis present

## 2023-02-02 DIAGNOSIS — E8729 Other acidosis: Secondary | ICD-10-CM | POA: Diagnosis present

## 2023-02-02 DIAGNOSIS — F1010 Alcohol abuse, uncomplicated: Secondary | ICD-10-CM | POA: Diagnosis present

## 2023-02-02 DIAGNOSIS — R112 Nausea with vomiting, unspecified: Secondary | ICD-10-CM | POA: Diagnosis not present

## 2023-02-02 LAB — HEMOGLOBIN A1C
Hgb A1c MFr Bld: 5.4 % (ref 4.8–5.6)
Mean Plasma Glucose: 108.28 mg/dL

## 2023-02-02 LAB — T4, FREE: Free T4: 0.62 ng/dL (ref 0.61–1.12)

## 2023-02-02 LAB — COMPREHENSIVE METABOLIC PANEL
ALT: 19 U/L (ref 0–44)
ALT: 19 U/L (ref 0–44)
AST: 30 U/L (ref 15–41)
AST: 27 U/L (ref 15–41)
Albumin: 4.2 g/dL (ref 3.5–5.0)
Albumin: 4.3 g/dL (ref 3.5–5.0)
Alkaline Phosphatase: 40 U/L (ref 38–126)
Alkaline Phosphatase: 40 U/L (ref 38–126)
Anion gap: 10 (ref 5–15)
Anion gap: 10 (ref 5–15)
BUN: 12 mg/dL (ref 8–23)
BUN: 9 mg/dL (ref 8–23)
CO2: 26 mmol/L (ref 22–32)
CO2: 26 mmol/L (ref 22–32)
Calcium: 8.4 mg/dL — ABNORMAL LOW (ref 8.9–10.3)
Calcium: 8.6 mg/dL — ABNORMAL LOW (ref 8.9–10.3)
Chloride: 97 mmol/L — ABNORMAL LOW (ref 98–111)
Chloride: 97 mmol/L — ABNORMAL LOW (ref 98–111)
Creatinine, Ser: 0.72 mg/dL (ref 0.44–1.00)
Creatinine, Ser: 0.74 mg/dL (ref 0.44–1.00)
GFR, Estimated: >60 mL/min (ref >60)
GFR, Estimated: >60 mL/min (ref >60)
Glucose, Bld: 175 mg/dL — ABNORMAL HIGH (ref 70–99)
Glucose, Bld: 106 mg/dL — ABNORMAL HIGH (ref 70–99)
Potassium: 3.1 mmol/L — ABNORMAL LOW (ref 3.5–5.1)
Potassium: 3.3 mmol/L — ABNORMAL LOW (ref 3.5–5.1)
Sodium: 133 mmol/L — ABNORMAL LOW (ref 135–145)
Sodium: 133 mmol/L — ABNORMAL LOW (ref 135–145)
Total Bilirubin: 0.5 mg/dL (ref 0.3–1.2)
Total Bilirubin: 0.8 mg/dL (ref 0.3–1.2)
Total Protein: 7.8 g/dL (ref 6.5–8.1)
Total Protein: 8.2 g/dL — ABNORMAL HIGH (ref 6.5–8.1)

## 2023-02-02 LAB — PROTIME-INR
INR: 1.2 (ref 0.8–1.2)
Prothrombin Time: 15.6 s — ABNORMAL HIGH (ref 11.4–15.2)

## 2023-02-02 LAB — CBC
HCT: 38.5 % (ref 36.0–46.0)
Hemoglobin: 13.6 g/dL (ref 12.0–15.0)
MCH: 33.3 pg (ref 26.0–34.0)
MCHC: 35.3 g/dL (ref 30.0–36.0)
MCV: 94.1 fL (ref 80.0–100.0)
Platelets: 133 10*3/uL — ABNORMAL LOW (ref 150–400)
RBC: 4.09 MIL/uL (ref 3.87–5.11)
RDW: 11.8 % (ref 11.5–15.5)
WBC: 5.8 10*3/uL (ref 4.0–10.5)
nRBC: 0 % (ref 0.0–0.2)

## 2023-02-02 LAB — ETHANOL: Alcohol, Ethyl (B): <10 mg/dL (ref <10)

## 2023-02-02 LAB — HIV ANTIBODY (ROUTINE TESTING W REFLEX): HIV Screen 4th Generation wRfx: NONREACTIVE

## 2023-02-02 LAB — LACTIC ACID, PLASMA
Lactic Acid, Venous: 1.7 mmol/L (ref 0.5–1.9)
Lactic Acid, Venous: 1.2 mmol/L (ref 0.5–1.9)

## 2023-02-02 LAB — MAGNESIUM
Magnesium: 2.1 mg/dL (ref 1.7–2.4)
Magnesium: 2.3 mg/dL (ref 1.7–2.4)

## 2023-02-02 LAB — TSH: TSH: 0.63 u[IU]/mL (ref 0.350–4.500)

## 2023-02-02 MED ORDER — THIAMINE HCL 100 MG/ML IJ SOLN
500.0000 mg | Freq: Every day | INTRAVENOUS | Status: DC
  Administered 2023-02-02 – 2023-02-03 (×2): 500 mg via INTRAVENOUS
  Filled 2023-02-02 (×2): qty 5

## 2023-02-02 MED ORDER — ADULT MULTIVITAMIN W/MINERALS CH
1.0000 | ORAL_TABLET | Freq: Every day | ORAL | Status: DC
  Administered 2023-02-02 – 2023-02-03 (×2): 1 via ORAL
  Filled 2023-02-02 (×2): qty 1

## 2023-02-02 MED ORDER — LACTATED RINGERS IV SOLN: INTRAVENOUS | Status: DC

## 2023-02-02 MED ORDER — ACETAMINOPHEN 650 MG RE SUPP: 650.0000 mg | Freq: Four times a day (QID) | RECTAL | Status: DC | PRN

## 2023-02-02 MED ORDER — ACETAMINOPHEN 325 MG PO TABS
650.0000 mg | ORAL_TABLET | Freq: Four times a day (QID) | ORAL | Status: DC | PRN
  Administered 2023-02-02 (×2): 650 mg via ORAL
  Filled 2023-02-02 (×2): qty 2

## 2023-02-02 MED ORDER — LORAZEPAM 1 MG PO TABS: 1.0000 mg | ORAL_TABLET | ORAL | Status: DC | PRN

## 2023-02-02 MED ORDER — HEPARIN SODIUM (PORCINE) 5000 UNIT/ML IJ SOLN
5000.0000 [IU] | Freq: Two times a day (BID) | INTRAMUSCULAR | Status: DC
  Administered 2023-02-02 – 2023-02-03 (×3): 5000 [IU] via SUBCUTANEOUS
  Filled 2023-02-02 (×4): qty 1

## 2023-02-02 MED ORDER — POTASSIUM CHLORIDE CRYS ER 20 MEQ PO TBCR
40.0000 meq | EXTENDED_RELEASE_TABLET | Freq: Once | ORAL | Status: AC
  Administered 2023-02-02: 40 meq via ORAL
  Filled 2023-02-02: qty 2

## 2023-02-02 MED ORDER — HYDRALAZINE HCL 20 MG/ML IJ SOLN: 5.0000 mg | Freq: Four times a day (QID) | INTRAMUSCULAR | Status: DC | PRN

## 2023-02-02 MED ORDER — THIAMINE HCL 100 MG/ML IJ SOLN: 100.0000 mg | Freq: Every day | INTRAMUSCULAR | Status: DC

## 2023-02-02 MED ORDER — FOLIC ACID 1 MG PO TABS
1.0000 mg | ORAL_TABLET | Freq: Every day | ORAL | Status: DC
  Administered 2023-02-02 – 2023-02-03 (×2): 1 mg via ORAL
  Filled 2023-02-02 (×2): qty 1

## 2023-02-02 MED ORDER — POTASSIUM CHLORIDE 10 MEQ/100ML IV SOLN
10.0000 meq | INTRAVENOUS | Status: AC
  Administered 2023-02-02 (×2): 10 meq via INTRAVENOUS
  Filled 2023-02-02 (×2): qty 100

## 2023-02-02 MED ORDER — SODIUM CHLORIDE 0.9% FLUSH
3.0000 mL | Freq: Two times a day (BID) | INTRAVENOUS | Status: DC
  Administered 2023-02-02 – 2023-02-03 (×4): 3 mL via INTRAVENOUS

## 2023-02-02 MED ORDER — THIAMINE MONONITRATE 100 MG PO TABS
100.0000 mg | ORAL_TABLET | Freq: Every day | ORAL | Status: DC
  Administered 2023-02-03: 100 mg via ORAL
  Filled 2023-02-02 (×2): qty 1

## 2023-02-02 MED ORDER — LORAZEPAM 2 MG/ML IJ SOLN: 1.0000 mg | INTRAMUSCULAR | Status: DC | PRN

## 2023-02-02 NOTE — Assessment & Plan Note (Signed)
Initial blood work showed elevated anion gap with metabolic acidosis mildly elevated beta hydroxy and an elevated glucose highly suspicious of DKA and undiagnosed or underlying diabetes. However with patient's history of alcohol I have discontinued patient's sliding scale insulin regimen and will obtain an A1c.  Alcohol level is pending.  Patient's acidosis presentation can be due to alcoholic ketoacidosis.  Will follow and manage continue with gentle IV fluid hydration overnight.

## 2023-02-02 NOTE — Assessment & Plan Note (Signed)
CPAP per home setting from tomorrow when pt's mentation is back to baseline.

## 2023-02-02 NOTE — Assessment & Plan Note (Signed)
Patient presented with intractable nausea and vomiting.  She received Phenergan 12.5, Zofran, Protonix, repeat Zofran, LR, Toradol, Haldol, D5 bolus, LR bolus, normal saline bolus. Admission requested for intractable nausea and vomiting.

## 2023-02-02 NOTE — Assessment & Plan Note (Signed)
Alcohol level pending lactic acid level pending. CIWA protocol.  Thiamine loading dose of 500 mg as patient is lethargic and somnolent.  Daily 100 mg thereafter.  Aspiration, fall, seizure precautions. TTS counseling per a.m. team.

## 2023-02-02 NOTE — Assessment & Plan Note (Signed)
Initial EKG on arrival shows prolonged QT of 636 which I suspect may be due to antiemetics, repeat EKG shows resolution.  Patient was given magnesium in the emergency room which I suspect may have been given for this.

## 2023-02-02 NOTE — Assessment & Plan Note (Signed)
On exam patient's abdomen is benign and soft nontender nondistended bowel sounds present.  As needed morphine. Differentials include GERD gastritis peptic ulcer disease related abdominal pain. Imaging with CT and ultrasound has been nonrevealing. Lipase is mildly elevated and this may be pancreatitis that is starting. Will follow. Currently patient will be NPO.  Will patient on LR for maintenance overnight.  IV PPI.

## 2023-02-02 NOTE — ED Notes (Signed)
Patient provided fluids. CB in reach. Will continue to monitor.

## 2023-02-02 NOTE — Assessment & Plan Note (Signed)
Suspect secondary to underlying portal hypertension.  On ultrasound today shows portal vein is patent.  Heparin for DVT prophylaxis.

## 2023-02-02 NOTE — Assessment & Plan Note (Signed)
Vitals:   02/01/23 1437 02/01/23 1930 02/01/23 2305  BP: 128/85 (!) 169/100 (!) 153/81  Patient has a history of hypertension and chart review however home med list does not show any antihypertensive medications. Will start patient on as needed hydralazine IV at a lower rate with parameters to prevent hypotension.

## 2023-02-02 NOTE — ED Notes (Signed)
Pt refuses to wear BP constantly to check BP hourly. Will check BP every 4 hrs.

## 2023-02-02 NOTE — H&P (Addendum)
History and Physical    Patient: Sherri Wagner VZD:[id number] DOB: Oct 26, 1955 DOA: 02/01/2023 DOS: the patient was seen and examined on 02/02/2023 PCP: Titus Mould, NP  Patient coming from: Home   Chief Complaint:  Chief Complaint  Patient presents with   Abdominal Pain    HPI: Sherri Wagner is a 67 y.o. female with medical history significant for hypertension coming in with abdominal pain and nausea vomiting since 11:00 early yesterday morning.  Per EMS report patient is detoxing from alcohol however history is unverified.  Alcohol level is ordered and pending.  Emergency room patient has been having dry heaving and spitting and repeated vomiting.  No reports of fever chills cough hematemesis hemoptysis diarrhea.  Flank pain no back pain headaches vision or speech or gait issues.  In the emergency room patient is somnolent oriented and arousable answers questions appropriately.  Initial EKG showed sinus rhythm 75 with a prolonged QTc of 636 on first EKG.  Repeat EKG done at 1835 shows Sinus rhythm at 63 PR of 158 QTc improved at 460, significant Q waves in lead V1, nonspecific ST changes in V5 V6.  Urinalysis is negative for nitrites leukocytes, clear urine.  CT of the abdomen and pelvis done is negative for any acute findings.  Normal gallbladder normal appendix.  Spoke to daughter Loleta Dicker at 12:39 AM discussed with her history of mom daughter states that mom is day 3 off of alcohol and does not smoke or use any drugs she does vape.  Daughter is out of town and is coming back tomorrow contact information is changed to her current telephone number. Venous blood gas shows pH of 7.4 pCO beta hydroxy at 0.37 glucose of 180. 2 of 44 pO2 of 33 bicarb of 29.9, CBC shows potassium of 3.3 glucose 180 anion gap of 18 lipase of 64 total protein 9.2 total bili 1.4. CBC shows platelets of 149 patient has had chronic intermittent thrombocytopenia suspect from her alcohol abuse and related  portal hypertension. Patient in the emergency room as somnolent.  But oriented and follows command. Review of Systems: Review of Systems  Unable to perform ROS: Mental status change (somnolence)    Past Medical History:  Diagnosis Date   Hypertension    Past Surgical History:  Procedure Laterality Date   ABDOMINAL HYSTERECTOMY     BREAST BIOPSY Left 1995   ROTATOR CUFF REPAIR     Social History:   reports that she has never smoked. She has never used smokeless tobacco. She reports current alcohol use. She reports that she does not use drugs.  Allergies  Allergen Reactions   Oxycodone Itching    Family History  Problem Relation Age of Onset   Breast cancer Neg Hx     Prior to Admission medications   Medication Sig Start Date End Date Taking? Authorizing Provider  alum & mag hydroxide-simeth (MAALOX MAX) 400-400-40 MG/5ML suspension Take 5 mLs by mouth every 6 (six) hours as needed for indigestion. 10/26/16   Nita Sickle, MD  Cholecalciferol (VITAMIN D3) 1000 units CAPS Take 1,000 Units by mouth daily.    [provider]  dicyclomine (BENTYL) 20 MG tablet Take 1 tablet (20 mg total) by mouth 3 (three) times daily as needed. 11/16/18   Phineas Semen, MD  famotidine (PEPCID) 20 MG tablet Take 1 tablet (20 mg total) by mouth 2 (two) times daily. 10/26/16 10/26/17  Nita Sickle, MD  ferrous sulfate 325 (65 FE) MG tablet Take 325 mg by mouth daily.  [provider]  gabapentin (NEURONTIN) 100 MG capsule Take 1 capsule (100 mg total) by mouth 3 (three) times daily for 15 days. 10/03/17 10/18/17  Sharman Cheek, MD  HYDROcodone-acetaminophen (NORCO/VICODIN) 5-325 MG tablet Take 1 tablet by mouth every 4 (four) hours as needed for moderate pain. 02/07/16   Tommi Rumps, PA-C  Multiple Vitamin (MULTIVITAMIN) tablet Take 1 tablet by mouth daily.    [provider]  nitrofurantoin (MACRODANTIN) 100 MG capsule Take 1 capsule (100 mg total) by  mouth 2 (two) times daily. 12/03/15   Sharman Cheek, MD  Omega-3 Fatty Acids (FISH OIL) 1000 MG CAPS Take 1,000 mg by mouth 2 (two) times daily.    [provider]  ondansetron (ZOFRAN ODT) 4 MG disintegrating tablet Take 1 tablet (4 mg total) by mouth every 8 (eight) hours as needed for nausea or vomiting. 10/26/16   Don Perking, [place], MD  ondansetron (ZOFRAN) 4 MG tablet Take 1 tablet (4 mg total) by mouth every 8 (eight) hours as needed. 11/16/18   Phineas Semen, MD  potassium chloride (K-DUR) 10 MEQ tablet Take 1 tablet (10 mEq total) by mouth daily. 11/05/17   Sherrie Mustache Roselyn Bering, PA-C  Urea 40 % LOTN Apply to feet once a day 08/01/22   Deirdre Evener, MD  vitamin B-12 (CYANOCOBALAMIN) 500 MCG tablet Take 500 mcg by mouth daily.    [provider]     Vitals:   02/01/23 1238 02/01/23 1437 02/01/23 1930 02/01/23 2305  BP:  128/85 (!) 169/100 (!) 153/81  Pulse:   66 65  Resp:  18 18 18   Temp:  97.8 F (36.6 C) 98.9 F (37.2 C)   TempSrc:  Oral    SpO2:   96% 100%  Weight: 77.1 kg     Height: 5\' 7"  (1.702 m)      Physical Exam Constitutional:      General: She is not in acute distress. HENT:     Right Ear: External ear normal.     Left Ear: External ear normal.     Mouth/Throat:     Mouth: Mucous membranes are moist.  Eyes:     Pupils: Pupils are equal, round, and reactive to light.  Cardiovascular:     Rate and Rhythm: Normal rate and regular rhythm.     Pulses: Normal pulses.     Heart sounds: Normal heart sounds.  Pulmonary:     Effort: Pulmonary effort is normal.     Breath sounds: Normal breath sounds.  Abdominal:     General: Bowel sounds are normal. There is no distension.     Palpations: Abdomen is soft.     Tenderness: There is no abdominal tenderness.  Musculoskeletal:     Right lower leg: No edema.     Left lower leg: No edema.  Skin:    General: Skin is warm.  Neurological:     General: No focal deficit present.     Mental Status: She  is oriented to person, place, and time.     Labs on Admission: I have personally reviewed following labs and imaging studies  CBC: Recent Labs  Lab 02/01/23 1249  WBC 7.8  HGB 14.2  HCT 40.6  MCV 94.9  PLT 149*   Basic Metabolic Panel: Recent Labs  Lab 02/01/23 1249  NA 137  K 3.3*  CL 98  CO2 21*  GLUCOSE 180*  BUN 26*  CREATININE 0.83  CALCIUM 9.9   GFR: Estimated Creatinine Clearance: 71.4  mL/min (by C-G formula based on SCr of 0.83 mg/dL). Liver Function Tests: Recent Labs  Lab 02/01/23 1249  AST 41  ALT 19  ALKPHOS 43  BILITOT 1.4*  PROT 9.2*  ALBUMIN 4.8   Recent Labs  Lab 02/01/23 1249  LIPASE 64*   No results for input(s): "AMMONIA" in the last 168 hours. Coagulation Profile: No results for input(s): "INR", "PROTIME" in the last 168 hours. Cardiac Enzymes: No results for input(s): "CKTOTAL", "CKMB", "CKMBINDEX", "TROPONINI" in the last 168 hours. BNP (last 3 results) No results for input(s): "PROBNP" in the last 8760 hours. HbA1C: No results for input(s): "HGBA1C" in the last 72 hours. CBG: No results for input(s): "GLUCAP" in the last 168 hours. Lipid Profile: No results for input(s): "CHOL", "HDL", "LDLCALC", "TRIG", "CHOLHDL", "LDLDIRECT" in the last 72 hours. Thyroid Function Tests: No results for input(s): "TSH", "T4TOTAL", "FREET4", "T3FREE", "THYROIDAB" in the last 72 hours. Anemia Panel: No results for input(s): "VITAMINB12", "FOLATE", "FERRITIN", "TIBC", "IRON", "RETICCTPCT" in the last 72 hours. Urinalysis    Component Value Date/Time   COLORURINE YELLOW (A) 02/01/2023 1625   APPEARANCEUR CLEAR (A) 02/01/2023 1625   LABSPEC 1.020 02/01/2023 1625   PHURINE 8.5 (H) 02/01/2023 1625   GLUCOSEU NEGATIVE 02/01/2023 1625   HGBUR NEGATIVE 02/01/2023 1625   BILIRUBINUR NEGATIVE 02/01/2023 1625   KETONESUR 15 (A) 02/01/2023 1625   PROTEINUR NEGATIVE 02/01/2023 1625   NITRITE NEGATIVE 02/01/2023 1625   LEUKOCYTESUR NEGATIVE 02/01/2023  1625   Unresulted Labs (From admission, onward)     Start     Ordered   02/02/23 0500  Comprehensive metabolic panel  Tomorrow morning,   R        02/02/23 0035   02/02/23 0500  CBC  Tomorrow morning,   R        02/02/23 0035   02/02/23 0500  Magnesium  Tomorrow morning,   R        02/02/23 0035   02/02/23 0048  Protime-INR  Add-on,   AD        02/02/23 0047   02/02/23 0045  HIV Antibody (routine testing w rflx)  Once,   R        02/02/23 0045   02/02/23 0045  Ethanol  Once,   AD        02/02/23 0045   02/01/23 2349  Comprehensive metabolic panel  ONCE - STAT,   STAT        02/01/23 2348   02/01/23 2347  T4, free  Once,   URGENT        02/01/23 2346   02/01/23 2347  TSH  Add-on,   AD        02/01/23 2346   02/01/23 2347  Magnesium  Add-on,   AD        02/01/23 2346   02/01/23 2346  Hemoglobin A1c  Once,   URGENT       Comments: To assess prior glycemic control    02/01/23 2345   02/01/23 2346  Lactic acid, plasma  (Lactic Acid)  STAT Now then every 3 hours,   STAT      02/01/23 2345            Medications  heparin injection 5,000 Units (has no administration in time range)  sodium chloride flush (NS) 0.9 % injection 3 mL (has no administration in time range)  acetaminophen (TYLENOL) tablet 650 mg (has no administration in time range)    Or  acetaminophen (TYLENOL)  suppository 650 mg (has no administration in time range)  lactated ringers infusion (has no administration in time range)  potassium chloride 10 mEq in 100 mL IVPB (has no administration in time range)  thiamine (VITAMIN B1) 500 mg in sodium chloride 0.9 % 50 mL IVPB (has no administration in time range)  LORazepam (ATIVAN) tablet 1-4 mg (has no administration in time range)    Or  LORazepam (ATIVAN) injection 1-4 mg (has no administration in time range)  thiamine (VITAMIN B1) tablet 100 mg (has no administration in time range)    Or  thiamine (VITAMIN B1) injection 100 mg (has no administration in time  range)  folic acid (FOLVITE) tablet 1 mg (has no administration in time range)  multivitamin with minerals tablet 1 tablet (has no administration in time range)  hydrALAZINE (APRESOLINE) injection 5 mg (has no administration in time range)  ondansetron (ZOFRAN) injection 4 mg (4 mg Intravenous Given 02/01/23 1251)  sodium chloride 0.9 % bolus 1,000 mL (0 mLs Intravenous Stopped 02/01/23 1720)  ketorolac (TORADOL) 15 MG/ML injection 15 mg (15 mg Intravenous Given 02/01/23 1430)  pantoprazole (PROTONIX) injection 40 mg (40 mg Intravenous Given 02/01/23 1430)  haloperidol lactate (HALDOL) injection 3 mg (3 mg Intravenous Given 02/01/23 1531)  magnesium sulfate IVPB 2 g 50 mL (0 g Intravenous Stopped 02/01/23 1856)  lactated ringers bolus 1,000 mL (0 mLs Intravenous Stopped 02/01/23 1924)  iohexol (OMNIPAQUE) 300 MG/ML solution 100 mL (100 mLs Intravenous Contrast Given 02/01/23 1809)  promethazine (PHENERGAN) 12.5 mg in sodium chloride 0.9 % 50 mL IVPB (0 mg Intravenous Stopped 02/01/23 1924)  dextrose 5% lactated ringers bolus 1,000 mL (1,000 mLs Intravenous New Bag/Given 02/01/23 1919)  ondansetron (ZOFRAN) injection 4 mg (4 mg Intravenous Given 02/01/23 2155)    Radiological Exams on Admission: CT ABDOMEN PELVIS W CONTRAST  Result Date: 02/01/2023 CLINICAL DATA:  Abdominal pain.  Acute nonlocalized EXAM: CT ABDOMEN AND PELVIS WITH CONTRAST TECHNIQUE: Multidetector CT imaging of the abdomen and pelvis was performed using the standard protocol following bolus administration of intravenous contrast. RADIATION DOSE REDUCTION: This exam was performed according to the departmental dose-optimization program which includes automated exposure control, adjustment of the mA and/or kV according to patient size and/or use of iterative reconstruction technique. CONTRAST:  OMNIPAQUE IOHEXOL 300 MG/ML  SOLN COMPARISON:  None Available. FINDINGS: Lower chest: Lung bases are clear. Hepatobiliary: No focal hepatic  lesion. Normal gallbladder. No biliary duct dilatation. Common bile duct is normal. Pancreas: Pancreas is normal. No ductal dilatation. No pancreatic inflammation. Spleen: Normal spleen Adrenals/urinary tract: Adrenal glands and kidneys are normal. The ureters and bladder normal. Stomach/Bowel: Hiatal hernia. Stomach, small bowel, appendix, and cecum are normal. The colon and rectosigmoid colon are normal. Vascular/Lymphatic: Abdominal aorta is normal caliber. No periportal or retroperitoneal adenopathy. No pelvic adenopathy. Reproductive: Post hysterectomy.  Adnexa unremarkable Other: Trace free fluid Musculoskeletal: No aggressive osseous lesion. IMPRESSION: 1. No acute findings in the abdomen pelvis. 2. Normal appendix. 3. Normal gallbladder. 4. Hiatal hernia. Electronically Signed   By: Genevive Bi M.D.   On: 02/01/2023 22:08   US ABDOMEN LIMITED RUQ (LIVER/GB)  Result Date: 02/01/2023 CLINICAL DATA:  ruq paih, vomiting EXAM: ULTRASOUND ABDOMEN LIMITED RIGHT UPPER QUADRANT COMPARISON:  11/16/2018 and previous FINDINGS: Gallbladder: No gallstones or wall thickening visualized. No sonographic Murphy sign noted by sonographer. Common bile duct: Diameter: 2.1 mm.  No intrahepatic biliary ductal dilatation. Liver: No focal lesion identified. Within normal limits in parenchymal echogenicity. Portal vein  is patent on color Doppler imaging with normal direction of blood flow towards the liver. Other: Technologist describes technically difficult study secondary to patient vomiting during exam. IMPRESSION: Negative. Electronically Signed   By: Corlis Leak M.D.   On: 02/01/2023 16:39     Data Reviewed: Relevant notes from primary care and specialist visits, past discharge summaries as available in EHR, including Care Everywhere. Prior diagnostic testing as pertinent to current admission diagnoses Updated medications and problem lists for reconciliation ED course, including vitals, labs, imaging, treatment  and response to treatment Triage notes, nursing and pharmacy notes and ED provider's notes Notable results as noted in HPI  Assessment and Plan: * Nausea and vomiting Patient presented with intractable nausea and vomiting.  She received Phenergan 12.5, Zofran, Protonix, repeat Zofran, LR, Toradol, Haldol, D5 bolus, LR bolus, normal saline bolus. Admission requested for intractable nausea and vomiting.  Abdominal pain On exam patient's abdomen is benign and soft nontender nondistended bowel sounds present.  As needed morphine. Differentials include GERD gastritis peptic ulcer disease related abdominal pain. Imaging with CT and ultrasound has been nonrevealing. Lipase is mildly elevated and this may be pancreatitis that is starting. Will follow. Currently patient will be NPO.  Will patient on LR for maintenance overnight.  IV PPI.    Hypokalemia Secondary to patient's intractable nausea and vomiting. Will replace and follow.   Increased anion gap metabolic acidosis Initial blood work showed elevated anion gap with metabolic acidosis mildly elevated beta hydroxy and an elevated glucose highly suspicious of DKA and undiagnosed or underlying diabetes. However with patient's history of alcohol I have discontinued patient's sliding scale insulin regimen and will obtain an A1c.  Alcohol level is pending.  Patient's acidosis presentation can be due to alcoholic ketoacidosis.  Will follow and manage continue with gentle IV fluid hydration overnight.   Thrombocytopenia (HCC) Suspect secondary to underlying portal hypertension.  On ultrasound today shows portal vein is patent.  Heparin for DVT prophylaxis.   Hypertension, benign Vitals:   02/01/23 1437 02/01/23 1930 02/01/23 2305  BP: 128/85 (!) 169/100 (!) 153/81  Patient has a history of hypertension and chart review however home med list does not show any antihypertensive medications. Will start patient on as needed hydralazine IV at a  lower rate with parameters to prevent hypotension.   Alcohol abuse Alcohol level pending lactic acid level pending. CIWA protocol.  Thiamine loading dose of 500 mg as patient is lethargic and somnolent.  Daily 100 mg thereafter.  Aspiration, fall, seizure precautions. TTS counseling per a.m. team.     Obstructive sleep apnea syndrome CPAP per home setting from tomorrow when pt's mentation is back to baseline.   Prolonged QT interval Initial EKG on arrival shows prolonged QT of 636 which I suspect may be due to antiemetics, repeat EKG shows resolution.  Patient was given magnesium in the emergency room which I suspect may have been given for this.   DVT prophylaxis:  Heparin   Consults:  None   Advance Care Planning:    Code Status: Full Code   Family Communication:  Hillary Bow - daughter on phone   Disposition Plan:  Home   Severity of Illness: The appropriate patient status for this patient is OBSERVATION. Observation status is judged to be reasonable and necessary in order to provide the required intensity of service to ensure the patient's safety. The patient's presenting symptoms, physical exam findings, and initial radiographic and laboratory data in the context of their medical condition is  felt to place them at decreased risk for further clinical deterioration. Furthermore, it is anticipated that the patient will be medically stable for discharge from the hospital within 2 midnights of admission.   Author: Gertha Calkin, MD 02/02/2023 1:05 AM  For on call review [email or web address].

## 2023-02-02 NOTE — TOC CM/SW Note (Signed)
CSW received consult for [facility] resources. Resources added to AVS.

## 2023-02-02 NOTE — Progress Notes (Signed)
  PROGRESS NOTE  Patient admitted earlier this morning. See H&P.   Patient admitted for symptoms of nausea, vomiting and abdominal pain.  Reported to be detoxing from alcohol, today is day #3 off alcohol use.  Patient was seen and examined in the emergency department.  Her nausea, vomiting and abdominal cramping has improved, has not had any vomiting since 2 AM.  Also had a headache, which has also improved with Tylenol.  Patient remains on CIWA protocol for withdrawal Advance diet as tolerated IVF  Replace potassium  Status is: Observation The patient will require care spanning > 2 midnights and should be moved to inpatient because: IVF and CIWA    Noralee Stain, DO [facility] 02/02/2023, 12:53 PM  Available via Epic secure chat 7am-7pm After these hours, please refer to coverage provider listed on [email or web address]

## 2023-02-02 NOTE — Assessment & Plan Note (Signed)
Secondary to patient's intractable nausea and vomiting. Will replace and follow.

## 2023-02-02 NOTE — ED Notes (Signed)
Delay in getting a full set of VS on pt due to pt sleeping. Will obtain VS once pt wakes up.

## 2023-02-03 DIAGNOSIS — E876 Hypokalemia: Secondary | ICD-10-CM | POA: Diagnosis not present

## 2023-02-03 DIAGNOSIS — I10 Essential (primary) hypertension: Secondary | ICD-10-CM | POA: Diagnosis not present

## 2023-02-03 DIAGNOSIS — F1010 Alcohol abuse, uncomplicated: Secondary | ICD-10-CM

## 2023-02-03 DIAGNOSIS — R112 Nausea with vomiting, unspecified: Secondary | ICD-10-CM | POA: Diagnosis not present

## 2023-02-03 LAB — MAGNESIUM: Magnesium: 2.2 mg/dL (ref 1.7–2.4)

## 2023-02-03 LAB — BASIC METABOLIC PANEL
Anion gap: 8 (ref 5–15)
BUN: 12 mg/dL (ref 8–23)
CO2: 25 mmol/L (ref 22–32)
Calcium: 8.9 mg/dL (ref 8.9–10.3)
Chloride: 107 mmol/L (ref 98–111)
Creatinine, Ser: 0.8 mg/dL (ref 0.44–1.00)
GFR, Estimated: >60 mL/min (ref >60)
Glucose, Bld: 90 mg/dL (ref 70–99)
Potassium: 3.9 mmol/L (ref 3.5–5.1)
Sodium: 137 mmol/L (ref 135–145)

## 2023-02-03 MED ORDER — VITAMIN B-1 100 MG PO TABS: 100.0000 mg | ORAL_TABLET | Freq: Every day | ORAL | 0 refills | Status: AC

## 2023-02-03 NOTE — Discharge Summary (Signed)
Physician Discharge Summary   Patient: Sherri Wagner MRN: [id number] DOB: Oct 03, 1955  Admit date:     02/01/2023  Discharge date: 02/03/23  Discharge Physician: Enedina Finner   PCP: Titus Mould, NP   Recommendations at discharge:   patient to remain abstaining from alcohol. Follow-up PCP in 1 to 2 weeks  Discharge Diagnoses: Principal Problem:   Nausea and vomiting Active Problems:   Abdominal pain   Hypokalemia   Increased anion gap metabolic acidosis   Thrombocytopenia (HCC)   Hypertension, benign   Alcohol abuse   Obstructive sleep apnea syndrome   Prolonged QT interval   Sherri Wagner is a 67 y.o. female with a past history of hypertension, alcohol abuse, rheumatoid arthritis follows with rheumatology at [facility] who comes ED complaining of upper abdominal pain with nausea and vomiting that started on the morning of admission  at about 11:00 AM, with dry heaving   Nausea vomiting and abdominal pain on admission now resolved -- could be related to alcohol use versus starting naltrexone shot and possible side effects from it -- patient was given the shot couple days before she came with nausea vomiting. -- She received IV fluids, placed on alcohol withdrawal protocol scoring zero at present. Overall able to tolerated PO diet. -- Electrolytes replaced -- patient given resources for alcohol/substance abuse in the community by social worker -- patient advised to remain abstain from alcohol. She voice understanding -- CT abdomen unremarkable  Hypokalemia/hyponatremia anion gap metabolic acidosis-- resolved -- replaced  Hypertension -- patient currently not on any meds. I have advised her to keep log of blood pressure at home and then discuss with PCP and defer PCP to start patient on antihypertensives if as outpatient at home blood pressure remains on the higher side.  Obstructive sleep apnea -- on CPAP  History of rheumatoid arthritis --  patient resumed on home meds with methotrexate and plaquenil -- she follows with [facility] rheumatology.  Overall hemodynamically stable. Tolerating PO diet. Patient agreeable to go home. She will follow-up with PCP as outpatient       Consultants: none Disposition: Home Diet recommendation:  Discharge Diet Orders (From admission, onward)     Start     Ordered   02/03/23 0000  Diet - low sodium heart healthy        02/03/23 1217           Cardiac diet DISCHARGE MEDICATION: Allergies as of 02/03/2023       Reactions   Oxycodone Itching        Medication List     STOP taking these medications    famotidine 20 MG tablet Commonly known as: PEPCID   gabapentin 100 MG capsule Commonly known as: Neurontin   HYDROcodone-acetaminophen 5-325 MG tablet Commonly known as: NORCO/VICODIN   nitrofurantoin 100 MG capsule Commonly known as: MACRODANTIN   ondansetron 4 MG tablet Commonly known as: Zofran   potassium chloride 10 MEQ tablet Commonly known as: KLOR-CON   Urea 40 % Lotn       TAKE these medications    alum & mag hydroxide-simeth 400-400-40 MG/5ML suspension Commonly known as: Maalox Max Take 5 mLs by mouth every 6 (six) hours as needed for indigestion.   azelastine 0.1 % nasal spray Commonly known as: ASTELIN Place 2 sprays into both nostrils 2 (two) times daily.   celecoxib 200 MG capsule Commonly known as: CELEBREX Take 200 mg by mouth daily.   dicyclomine 20 MG tablet Commonly known as: Bentyl  Take 1 tablet (20 mg total) by mouth 3 (three) times daily as needed.   DULoxetine 20 MG capsule Commonly known as: CYMBALTA Take 20 mg by mouth daily.   ferrous sulfate 325 (65 FE) MG tablet Take 325 mg by mouth daily.   Fish Oil 1000 MG Caps Take 1,000 mg by mouth 2 (two) times daily.   folic acid 1 MG tablet Commonly known as: FOLVITE Take 1 mg by mouth daily.   hydroxychloroquine 200 MG tablet Commonly known as: PLAQUENIL Take 200 mg by  mouth 2 (two) times daily.   ipratropium 0.06 % nasal spray Commonly known as: ATROVENT Place 2 sprays into both nostrils 3 (three) times daily.   methotrexate 2.5 MG tablet Commonly known as: RHEUMATREX Take 12.5 mg by mouth once a week. Tues   montelukast 10 MG tablet Commonly known as: SINGULAIR Take 10 mg by mouth at bedtime.   multivitamin tablet Take 1 tablet by mouth daily.   ondansetron 4 MG disintegrating tablet Commonly known as: Zofran ODT Take 1 tablet (4 mg total) by mouth every 8 (eight) hours as needed for nausea or vomiting.   pregabalin 100 MG capsule Commonly known as: LYRICA Take 100 mg by mouth at bedtime.   sertraline 50 MG tablet Commonly known as: ZOLOFT Take 50 mg by mouth daily.   thiamine 100 MG tablet Commonly known as: Vitamin B-1 Take 1 tablet (100 mg total) by mouth daily. Start taking on: February 04, 2023   vitamin B-12 500 MCG tablet Commonly known as: CYANOCOBALAMIN Take 500 mcg by mouth daily.   Vitamin D3 25 MCG (1000 UT) Caps Take 1,000 Units by mouth daily.   Vivitrol 380 MG Susr Generic drug: Naltrexone Inject 380 mg into the muscle every 30 (thirty) days.        Follow-up Information     Titus Mould, NP. Schedule an appointment as soon as possible for a visit in 1 week(s).   Specialty: Family Medicine Why: Hospital follow-up Contact information: [address] [place] [address] [phone]                Discharge Exam: Ceasar Mons Weights   02/01/23 1238 02/03/23 0406  Weight: 77.1 kg 84.3 kg   Alert and oriented times three respiratory clear to auscultation cardiovascular both heart sounds are normal no murmur neuro- grossly intact.  Condition at discharge: fair  The results of significant diagnostics from this hospitalization (including imaging, microbiology, ancillary and laboratory) are listed below for reference.   Imaging Studies: CT Head Wo Contrast  Result Date:  02/02/2023 CLINICAL DATA:  67 year old female new onset headache. EXAM: CT HEAD WITHOUT CONTRAST TECHNIQUE: Contiguous axial images were obtained from the base of the skull through the vertex without intravenous contrast. RADIATION DOSE REDUCTION: This exam was performed according to the departmental dose-optimization program which includes automated exposure control, adjustment of the mA and/or kV according to patient size and/or use of iterative reconstruction technique. COMPARISON:  Brain MRI 06/06/2005.  Head CT 02/26/2009. FINDINGS: Brain: Cerebral volume loss since 2010 appears fairly generalized. 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 No midline shift, ventriculomegaly, mass effect, evidence of mass lesion, intracranial hemorrhage or evidence of cortically based acute infarction. Vascular: No suspicious intracranial vascular hyperdensity. Skull: No acute osseous abnormality identified. Sinuses/Orbits: Visualized paranasal sinuses and mastoids are clear. Other: Mildly Disconjugate gaze, otherwise negative orbit and  scalp soft tissues. IMPRESSION: 1. No acute intracranial abnormality. 2. 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 Electronically Signed   By: Odessa Fleming M.D.   On: 02/02/2023 07:42   CT ABDOMEN PELVIS W CONTRAST  Result Date: 02/01/2023 CLINICAL DATA:  Abdominal pain.  Acute nonlocalized EXAM: CT ABDOMEN AND PELVIS WITH CONTRAST TECHNIQUE: Multidetector CT imaging of the abdomen and pelvis was performed using the standard protocol following bolus administration of intravenous contrast. RADIATION DOSE REDUCTION: This exam was performed according to the departmental dose-optimization program which includes automated exposure control, adjustment of the mA and/or kV according to  patient size and/or use of iterative reconstruction technique. CONTRAST:  OMNIPAQUE IOHEXOL 300 MG/ML  SOLN COMPARISON:  None Available. FINDINGS: Lower chest: Lung bases are clear. Hepatobiliary: No focal hepatic lesion. Normal gallbladder. No biliary duct dilatation. Common bile duct is normal. Pancreas: Pancreas is normal. No ductal dilatation. No pancreatic inflammation. Spleen: Normal spleen Adrenals/urinary tract: Adrenal glands and kidneys are normal. The ureters and bladder normal. Stomach/Bowel: Hiatal hernia. Stomach, small bowel, appendix, and cecum are normal. The colon and rectosigmoid colon are normal. Vascular/Lymphatic: Abdominal aorta is normal caliber. No periportal or retroperitoneal adenopathy. No pelvic adenopathy. Reproductive: Post hysterectomy.  Adnexa unremarkable Other: Trace free fluid Musculoskeletal: No aggressive osseous lesion. IMPRESSION: 1. No acute findings in the abdomen pelvis. 2. Normal appendix. 3. Normal gallbladder. 4. Hiatal hernia. Electronically Signed   By: Genevive Bi M.D.   On: 02/01/2023 22:08   US ABDOMEN LIMITED RUQ (LIVER/GB)  Result Date: 02/01/2023 CLINICAL DATA:  ruq paih, vomiting EXAM: ULTRASOUND ABDOMEN LIMITED RIGHT UPPER QUADRANT COMPARISON:  11/16/2018 and previous FINDINGS: Gallbladder: No gallstones or wall thickening visualized. No sonographic Murphy sign noted by sonographer. Common bile duct: Diameter: 2.1 mm.  No intrahepatic biliary ductal dilatation. Liver: No focal lesion identified. Within normal limits in parenchymal echogenicity. Portal vein is patent on color Doppler imaging with normal direction of blood flow towards the liver. Other: Technologist describes technically difficult study secondary to patient vomiting during exam. IMPRESSION: Negative. Electronically Signed   By: Corlis Leak M.D.   On: 02/01/2023 16:39    Microbiology: Results for orders placed or performed in visit on 03/19/19  Novel Coronavirus, NAA ([facility])      Status: None   Collection Time: 03/19/19 10:16 AM   Specimen: Nasopharyngeal(NP) swabs in vial transport medium   NASOPHARYNGE  TESTING  Result Value Ref Range Status   SARS-CoV-2, NAA Not Detected Not Detected Final    Comment: This nucleic acid amplification test was developed and its performance characteristics determined by [employer]. Nucleic acid amplification tests include PCR and TMA. This test has not been FDA cleared or approved. This test has been authorized by FDA under an Emergency Use Authorization (EUA). This test is only authorized for the duration of time the declaration that circumstances exist justifying the authorization of the emergency use of in vitro diagnostic tests for detection of SARS-CoV-2 virus and/or diagnosis of COVID-19 infection under section 564(b)(1) of the Act, 21 U.S.C. 956LOV-5(I) (1), unless the authorization is terminated or revoked sooner. When diagnostic testing is negative, the possibility of a false negative result should be considered in the context of a patient's recent exposures and the presence of clinical signs and symptoms consistent with COVID-19. An individual without symptoms of COVID-19 and who is not shedding SARS-CoV-2 virus would  expect to have a negative (not detected) result in this assay.  Labs: CBC: Recent Labs  Lab 02/01/23 1249 02/02/23 0653  WBC 7.8 5.8  HGB 14.2 13.6  HCT 40.6 38.5  MCV 94.9 94.1  PLT 149* 133*   Basic Metabolic Panel: Recent Labs  Lab 02/01/23 1249 02/01/23 1938 02/02/23 0653 02/03/23 0449  NA 137 133* 133* 137  K 3.3* 3.1* 3.3* 3.9  CL 98 97* 97* 107  CO2 21* 26 26 25   GLUCOSE 180* 175* 106* 90  BUN 26* 12 9 12   CREATININE 0.83 0.72 0.74 0.80  CALCIUM 9.9 8.4* 8.6* 8.9  MG  --  2.1 2.3 2.2   Liver Function Tests: Recent Labs  Lab 02/01/23 1249 02/01/23 1938 02/02/23 0653  AST 41 30 27  ALT 19 19 19   ALKPHOS 43 40 40  BILITOT 1.4* 0.5 0.8  PROT 9.2* 7.8  8.2*  ALBUMIN 4.8 4.2 4.3    Discharge time spent: greater than 30 minutes.  Signed: Enedina Finner, MD [facility] 02/03/2023

## 2023-02-14 ENCOUNTER — Ambulatory Visit
Admission: RE | Admit: 2023-02-14 | Discharge: 2023-02-14 | Disposition: A | Discharge status: Home / Self Care | Payer: Medicare HMO | Attending: Family Medicine | Admitting: Family Medicine

## 2023-02-14 ENCOUNTER — Ambulatory Visit
Admission: RE | Admit: 2023-02-14 | Discharge: 2023-02-14 | Disposition: A | Discharge status: Home / Self Care | Payer: Medicare HMO | Source: Ambulatory Visit | Attending: Family Medicine | Admitting: Family Medicine

## 2023-02-14 ENCOUNTER — Other Ambulatory Visit: Payer: Self-pay | Admitting: Family Medicine

## 2023-02-14 DIAGNOSIS — M25512 Pain in left shoulder: Secondary | ICD-10-CM | POA: Diagnosis present

## 2023-02-18 ENCOUNTER — Other Ambulatory Visit: Payer: Self-pay | Admitting: Family Medicine

## 2023-02-18 DIAGNOSIS — Z1231 Encounter for screening mammogram for malignant neoplasm of breast: Secondary | ICD-10-CM

## 2023-03-06 ENCOUNTER — Ambulatory Visit (INDEPENDENT_AMBULATORY_CARE_PROVIDER_SITE_OTHER): Payer: Medicare HMO | Admitting: Obstetrics and Gynecology

## 2023-03-06 ENCOUNTER — Encounter: Payer: Self-pay | Admitting: Obstetrics and Gynecology

## 2023-03-06 VITALS — BP 105/70 | HR 69 | Ht 67.0 in | Wt 172.9 lb

## 2023-03-06 DIAGNOSIS — Z7689 Persons encountering health services in other specified circumstances: Secondary | ICD-10-CM

## 2023-03-06 DIAGNOSIS — N8189 Other female genital prolapse: Secondary | ICD-10-CM

## 2023-03-06 DIAGNOSIS — N8110 Cystocele, unspecified: Secondary | ICD-10-CM

## 2023-03-06 NOTE — Progress Notes (Signed)
HPI:      [name] is a 67 y.o. [id number] who LMP was No LMP recorded. Patient has had a hysterectomy.  Subjective:   She presents today with complaint of something bulging from the vagina.  This happened approximately 1 month ago.  Since that time it has happened twice.  It is something that she noticed after straining for having a bowel movement.  She has some concerns about it but is not disabling and it is not painful.  She denies urine loss at this time.  She is not having problems with bowel movements. Significant note she previously had a hysterectomy many years ago.    Hx: The following portions of the patient's history were reviewed and updated as appropriate:             She  has a past medical history of Hypertension. She does not have any pertinent problems on file. She  has a past surgical history that includes Rotator cuff repair; Abdominal hysterectomy; and Breast biopsy (Left, 1995). Her family history is not on file. She  reports that she has never smoked. She has never used smokeless tobacco. She reports current alcohol use. She reports that she does not use drugs. She has a current medication list which includes the following prescription(s): alum & mag hydroxide-simeth, azelastine, celecoxib, vitamin d3, dicyclomine, duloxetine, ferrous sulfate, folic acid, hydroxychloroquine, ipratropium, methotrexate, montelukast, multivitamin, fish oil, ondansetron, pregabalin, sertraline, thiamine, vitamin b-12, and vivitrol. She is allergic to oxycodone.       Review of Systems:  Review of Systems  Constitutional: Denied constitutional symptoms, night sweats, recent illness, fatigue, fever, insomnia and weight loss.  Eyes: Denied eye symptoms, eye pain, photophobia, vision change and visual disturbance.  Ears/Nose/Throat/Neck: Denied ear, nose, throat or neck symptoms, hearing loss, nasal discharge, sinus congestion and sore throat.  Cardiovascular: Denied cardiovascular  symptoms, arrhythmia, chest pain/pressure, edema, exercise intolerance, orthopnea and palpitations.  Respiratory: Denied pulmonary symptoms, asthma, pleuritic pain, productive sputum, cough, dyspnea and wheezing.  Gastrointestinal: Denied, gastro-esophageal reflux, melena, nausea and vomiting.  Genitourinary: See HPI for additional information.  Musculoskeletal: Denied musculoskeletal symptoms, stiffness, swelling, muscle weakness and myalgia.  Dermatologic: Denied dermatology symptoms, rash and scar.  Neurologic: Denied neurology symptoms, dizziness, headache, neck pain and syncope.  Psychiatric: Denied psychiatric symptoms, anxiety and depression.  Endocrine: Denied endocrine symptoms including hot flashes and night sweats.   Meds:   Current Outpatient Medications on File Prior to Visit  Medication Sig Dispense Refill   alum & mag hydroxide-simeth (MAALOX MAX) 400-400-40 MG/5ML suspension Take 5 mLs by mouth every 6 (six) hours as needed for indigestion. (Patient not taking: Reported on 03/06/2023) 355 mL 0   azelastine (ASTELIN) 0.1 % nasal spray Place 2 sprays into both nostrils 2 (two) times daily.     celecoxib (CELEBREX) 200 MG capsule Take 200 mg by mouth daily.     Cholecalciferol (VITAMIN D3) 1000 units CAPS Take 1,000 Units by mouth daily.     dicyclomine (BENTYL) 20 MG tablet Take 1 tablet (20 mg total) by mouth 3 (three) times daily as needed. 30 tablet 0   DULoxetine (CYMBALTA) 20 MG capsule Take 20 mg by mouth daily.     ferrous sulfate 325 (65 FE) MG tablet Take 325 mg by mouth daily.     folic acid (FOLVITE) 1 MG tablet Take 1 mg by mouth daily.     hydroxychloroquine (PLAQUENIL) 200 MG tablet Take 200 mg by mouth 2 (two) times daily.  ipratropium (ATROVENT) 0.06 % nasal spray Place 2 sprays into both nostrils 3 (three) times daily.     methotrexate (RHEUMATREX) 2.5 MG tablet Take 12.5 mg by mouth once a week. Tues     montelukast (SINGULAIR) 10 MG tablet Take 10 mg by  mouth at bedtime.     Multiple Vitamin (MULTIVITAMIN) tablet Take 1 tablet by mouth daily.     Omega-3 Fatty Acids (FISH OIL) 1000 MG CAPS Take 1,000 mg by mouth 2 (two) times daily. (Patient not taking: Reported on 03/06/2023)     ondansetron (ZOFRAN ODT) 4 MG disintegrating tablet Take 1 tablet (4 mg total) by mouth every 8 (eight) hours as needed for nausea or vomiting. 20 tablet 0   pregabalin (LYRICA) 100 MG capsule Take 100 mg by mouth at bedtime.     sertraline (ZOLOFT) 50 MG tablet Take 50 mg by mouth daily.     thiamine (VITAMIN B-1) 100 MG tablet Take 1 tablet (100 mg total) by mouth daily. 30 tablet 0   vitamin B-12 (CYANOCOBALAMIN) 500 MCG tablet Take 500 mcg by mouth daily.     VIVITROL 380 MG SUSR Inject 380 mg into the muscle every 30 (thirty) days.     No current facility-administered medications on file prior to visit.      Objective:     Vitals:   03/06/23 0811  BP: 105/70  Pulse: 69   Filed Weights   03/06/23 0811  Weight: 172 lb 14.4 oz (78.4 kg)              Physical examination   Pelvic:   Vulva: Normal appearance.  No lesions.  Vagina: No lesions or abnormalities noted.  Support: Second-degree apical prolapse, second-degree cystocele, second-degree rectocele.  Urethra No masses tenderness or scarring.  Meatus Normal size without lesions or prolapse.  Cervix: Surgically absent  Anus: Normal exam.  No lesions.  Perineum: Normal exam.  No lesions.             Assessment:    [id number] Patient Active Problem List   Diagnosis Date Noted   Abdominal pain 02/02/2023   Increased anion gap metabolic acidosis 02/02/2023   Alcohol abuse 02/02/2023   Prolonged QT interval 02/02/2023   Hypokalemia 02/01/2023   Nausea and vomiting 02/01/2023   Obstructive sleep apnea syndrome 04/17/2016   Thrombocytopenia (HCC) 08/22/2015   Hypertension, benign 07/03/2012     1. Establishing care with new doctor, encounter for   2. Pelvic relaxation     Pelvic  relaxation as described above.  At this time patient relatively asymptomatic and it does not occur often.   Plan:            1.  We have discussed pelvic relaxation in detail.  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  All of her questions were answered.  As she is not symptomatic she would likely not have anything done at this time.  If things change in the future she will certainly let us know. Orders No orders of the defined types were placed in this encounter.   No orders of the defined types were placed in this encounter.     F/U  Return for Annual Physical. I spent 31 minutes involved in the care of this patient preparing to see the patient by obtaining and reviewing her medical history (including labs, imaging tests and prior procedures), documenting clinical information in the electronic health record (EHR), counseling and coordinating care plans, writing and sending prescriptions, ordering tests  or procedures and in direct communicating with the patient and medical staff discussing pertinent items from her history and physical exam.  Elonda Husky, M.D. 03/06/2023 9:05 AM

## 2023-03-06 NOTE — Progress Notes (Signed)
Patient presents today to discuss prolapse. She states noticing a bulge one month ago, denies pain. History of hysterectomy due to heavy bleeding.

## 2023-03-07 ENCOUNTER — Encounter: Payer: Medicare HMO | Admitting: Obstetrics and Gynecology

## 2023-03-24 ENCOUNTER — Ambulatory Visit
Admission: RE | Admit: 2023-03-24 | Discharge: 2023-03-24 | Disposition: A | Discharge status: Home / Self Care | Payer: Medicare HMO | Source: Ambulatory Visit | Attending: Family Medicine | Admitting: Family Medicine

## 2023-03-24 DIAGNOSIS — Z1231 Encounter for screening mammogram for malignant neoplasm of breast: Secondary | ICD-10-CM | POA: Insufficient documentation

## 2023-11-25 ENCOUNTER — Other Ambulatory Visit: Payer: Self-pay | Admitting: Family Medicine

## 2023-11-25 DIAGNOSIS — Z122 Encounter for screening for malignant neoplasm of respiratory organs: Secondary | ICD-10-CM

## 2023-12-02 ENCOUNTER — Ambulatory Visit
Admission: RE | Admit: 2023-12-02 | Discharge: 2023-12-02 | Disposition: A | Discharge status: Home / Self Care | Source: Ambulatory Visit | Attending: Family Medicine | Admitting: Family Medicine

## 2023-12-02 DIAGNOSIS — Z87891 Personal history of nicotine dependence: Secondary | ICD-10-CM | POA: Diagnosis not present

## 2023-12-02 DIAGNOSIS — Z122 Encounter for screening for malignant neoplasm of respiratory organs: Secondary | ICD-10-CM | POA: Insufficient documentation

## 2023-12-02 DIAGNOSIS — J439 Emphysema, unspecified: Secondary | ICD-10-CM | POA: Insufficient documentation

## 2024-01-13 ENCOUNTER — Ambulatory Visit (INDEPENDENT_AMBULATORY_CARE_PROVIDER_SITE_OTHER): Admitting: Vascular Surgery

## 2024-01-13 ENCOUNTER — Encounter (INDEPENDENT_AMBULATORY_CARE_PROVIDER_SITE_OTHER): Payer: Self-pay | Admitting: Vascular Surgery

## 2024-01-13 VITALS — BP 109/75 | HR 72 | Resp 18 | Ht 67.5 in | Wt 159.0 lb

## 2024-01-13 DIAGNOSIS — I10 Essential (primary) hypertension: Secondary | ICD-10-CM

## 2024-01-13 DIAGNOSIS — I712 Thoracic aortic aneurysm, without rupture, unspecified: Secondary | ICD-10-CM | POA: Insufficient documentation

## 2024-01-13 DIAGNOSIS — I7121 Aneurysm of the ascending aorta, without rupture: Secondary | ICD-10-CM

## 2024-01-13 NOTE — Assessment & Plan Note (Signed)
blood pressure control important in reducing the progression of atherosclerotic disease and aneurysmal growth. On appropriate oral medications.  

## 2024-01-13 NOTE — Assessment & Plan Note (Signed)
 recently noted thoracic aortic aneurysm on a lung cancer screening CT. this was a lung cancer screening CT and she did not have any significant symptoms.  I have independently reviewed the CT scan.  Her ascending thoracic aorta measures approximately 4.2 cm in maximal diameter.  Given this finding, she is referred for further evaluation and treatment. We had a long discussion today about the pathophysiology and natural history of thoracic aortic aneurysms.  At this size, it poses her no immediate risk.  This would be followed on an annual basis with CT scan.  I have discussed the importance of blood pressure control primarily as a way of avoiding growth of the aneurysm.  She does not smoke but avoidance of tobacco is important as well.  CT scan of the chest in 1 year and then see me in follow-up.

## 2024-01-13 NOTE — Progress Notes (Signed)
 Patient ID: Sherri Wagner, female   DOB: 12-Aug-1955, 68 y.o.   MRN: [id number]  Chief Complaint  Patient presents with   New Patient (Initial Visit)    Ref Tor consult AAA found on lung cancer screening     HPI Sherri Wagner is a 68 y.o. female.  I am asked to see the patient by Dr. Tor for evaluation of a recently noted thoracic aortic aneurysm on a lung cancer screening CT. this was a lung cancer screening CT and she did not have any significant symptoms.  I have independently reviewed the CT scan.  Her ascending thoracic aorta measures approximately 4.2 cm in maximal diameter.  Given this finding, she is referred for further evaluation and treatment.   Past Medical History:  Diagnosis Date   Hypertension     Past Surgical History:  Procedure Laterality Date   ABDOMINAL HYSTERECTOMY     BREAST BIOPSY Left 1995   ROTATOR CUFF REPAIR       Family History  Problem Relation Age of Onset   Breast cancer Neg Hx   No bleeding or clotting disorders No aneurysms   Social History   Tobacco Use   Smoking status: Never   Smokeless tobacco: Never  Vaping Use   Vaping status: Some Days  Substance Use Topics   Alcohol use: Yes    Comment: occasionally   Drug use: No     Allergies  Allergen Reactions   Oxycodone Itching    oxycodone    Current Outpatient Medications  Medication Sig Dispense Refill   azelastine (ASTELIN) 0.1 % nasal spray Place 2 sprays into both nostrils 2 (two) times daily.     celecoxib (CELEBREX) 200 MG capsule Take 200 mg by mouth daily.     Cholecalciferol (VITAMIN D3) 1000 units CAPS Take 1,000 Units by mouth daily.     dicyclomine  (BENTYL ) 20 MG tablet Take 1 tablet (20 mg total) by mouth 3 (three) times daily as needed. 30 tablet 0   DULoxetine (CYMBALTA) 20 MG capsule Take 20 mg by mouth daily.     ferrous sulfate 325 (65 FE) MG tablet Take 325 mg by mouth daily.     folic acid  (FOLVITE ) 1 MG tablet Take 1 mg by mouth daily.      hydroxychloroquine (PLAQUENIL) 200 MG tablet Take 200 mg by mouth 2 (two) times daily.     ipratropium (ATROVENT) 0.06 % nasal spray Place 2 sprays into both nostrils 3 (three) times daily.     methotrexate (RHEUMATREX) 2.5 MG tablet Take 12.5 mg by mouth once a week. Tues     montelukast (SINGULAIR) 10 MG tablet Take 10 mg by mouth at bedtime.     Multiple Vitamin (MULTIVITAMIN) tablet Take 1 tablet by mouth daily.     ondansetron  (ZOFRAN  ODT) 4 MG disintegrating tablet Take 1 tablet (4 mg total) by mouth every 8 (eight) hours as needed for nausea or vomiting. 20 tablet 0   pregabalin (LYRICA) 100 MG capsule Take 100 mg by mouth at bedtime.     sertraline (ZOLOFT) 50 MG tablet Take 50 mg by mouth daily.     thiamine  (VITAMIN B-1) 100 MG tablet Take 1 tablet (100 mg total) by mouth daily. 30 tablet 0   vitamin B-12 (CYANOCOBALAMIN) 500 MCG tablet Take 500 mcg by mouth daily.     VIVITROL 380 MG SUSR Inject 380 mg into the muscle every 30 (thirty) days.     alum & mag hydroxide-simeth (MAALOX  MAX) 400-400-40 MG/5ML suspension Take 5 mLs by mouth every 6 (six) hours as needed for indigestion. (Patient not taking: Reported on 01/13/2024) 355 mL 0   Omega-3 Fatty Acids (FISH OIL) 1000 MG CAPS Take 1,000 mg by mouth 2 (two) times daily. (Patient not taking: Reported on 01/13/2024)     No current facility-administered medications for this visit.      REVIEW OF SYSTEMS (Negative unless checked)  Constitutional: [] Weight loss  [] Fever  [] Chills Cardiac: [] Chest pain   [] Chest pressure   [] Palpitations   [] Shortness of breath when laying flat   [] Shortness of breath at rest   [] Shortness of breath with exertion. Vascular:  [] Pain in legs with walking   [] Pain in legs at rest   [] Pain in legs when laying flat   [] Claudication   [] Pain in feet when walking  [] Pain in feet at rest  [] Pain in feet when laying flat   [] History of DVT   [] Phlebitis   [] Swelling in legs   [] Varicose veins   [] Non-healing  ulcers Pulmonary:   [] Uses home oxygen   [] Productive cough   [] Hemoptysis   [] Wheeze  [] COPD   [] Asthma Neurologic:  [] Dizziness  [] Blackouts   [] Seizures   [] History of stroke   [] History of TIA  [] Aphasia   [] Temporary blindness   [] Dysphagia   [] Weakness or numbness in arms   [] Weakness or numbness in legs Musculoskeletal:  [] Arthritis   [] Joint swelling   [] Joint pain   [] Low back pain Hematologic:  [] Easy bruising  [] Easy bleeding   [] Hypercoagulable state   [] Anemic  [] Hepatitis Gastrointestinal:  [] Blood in stool   [] Vomiting blood  [] Gastroesophageal reflux/heartburn   [] Abdominal pain Genitourinary:  [] Chronic kidney disease   [] Difficult urination  [] Frequent urination  [] Burning with urination   [] Hematuria Skin:  [] Rashes   [] Ulcers   [] Wounds Psychological:  [] History of anxiety   []  History of major depression.    Physical Exam BP 109/75   Pulse 72   Resp 18   Ht 5' 7.5 (1.715 m)   Wt 159 lb (72.1 kg)   BMI 24.54 kg/m  Gen:  WD/WN, NAD.  Appears younger than stated age Head: [place]/AT, No temporalis wasting.  Ear/Nose/Throat: Hearing grossly intact, nares w/o erythema or drainage, oropharynx w/o Erythema/Exudate Eyes: Conjunctiva clear, sclera non-icteric  Neck: trachea midline.  No JVD.  Pulmonary:  Good air movement, respirations not labored, no use of accessory muscles  Cardiac: RRR, no JVD Vascular:  Vessel Right Left  Radial Palpable Palpable                                   Gastrointestinal:. No masses, surgical incisions, or scars. Musculoskeletal: M/S 5/5 throughout.  Extremities without ischemic changes.  No deformity or atrophy.  No edema. Neurologic: Sensation grossly intact in extremities.  Symmetrical.  Speech is fluent. Motor exam as listed above. Psychiatric: Judgment intact, Mood & affect appropriate for pt's clinical situation. Dermatologic: No rashes or ulcers noted.  No cellulitis or open wounds.    Radiology No results  found.  Labs No results found for this or any previous visit (from the past 2160 hours).  Assessment/Plan:  Aneurysm of thoracic aorta (HCC) recently noted thoracic aortic aneurysm on a lung cancer screening CT. this was a lung cancer screening CT and she did not have any significant symptoms.  I have independently reviewed the CT scan.  Her ascending  thoracic aorta measures approximately 4.2 cm in maximal diameter.  Given this finding, she is referred for further evaluation and treatment. We had a long discussion today about the pathophysiology and natural history of thoracic aortic aneurysms.  At this size, it poses her no immediate risk.  This would be followed on an annual basis with CT scan.  I have discussed the importance of blood pressure control primarily as a way of avoiding growth of the aneurysm.  She does not smoke but avoidance of tobacco is important as well.  CT scan of the chest in 1 year and then see me in follow-up.  Hypertension, benign blood pressure control important in reducing the progression of atherosclerotic disease and aneurysmal growth. On appropriate oral medications.      Selinda Gu 01/13/2024, 11:25 AM   This note was created with Dragon medical transcription system.  Any errors from dictation are unintentional.

## 2024-01-13 NOTE — Patient Instructions (Signed)
Thoracic Aortic Aneurysm  An aneurysm is a bulge in an artery. It happens when blood pushes against a weak or damaged artery wall. A thoracic aortic aneurysm (TAA) happens in the upper part of the aorta, between the heart and the diaphragm. The aorta is the largest artery of the body. It supplies blood from the heart to the rest of the body. Some aneurysms may not cause symptoms. But a TAA can cause two serious problems: It can grow and then burst (rupture). It can cause blood to flow between the layers of the wall of the aorta through a tear (aortic dissection). These problems are medical emergencies. They can cause internal bleeding and should be treated right away. What are the causes? A TAA may be caused by: Cystic medial degeneration. This is when the fibers of the wall of the aorta become weak and expand. A genetic disease that weakens body tissue. This includes Marfan syndrome. In some cases, the cause is not known. What increases the risk? You may be more likely to have an aneurysm if: You are 53 years of age or older. You have a family history of aneurysms. You use or have used nicotine or tobacco products. You have: Arteriosclerosis. This is when your arteries harden. Arteritis. This is inflammation of the walls of an artery. An injury or trauma to your aorta. High blood pressure (hypertension). High cholesterol. Bicuspid aortic valve. This is when only two flaps (valves) over your aorta are working instead of three. This can make it harder for your heart to pump blood. What are the signs or symptoms? Symptoms depend on how big the aneurysm is and how fast it is growing. Most grow slowly and do not cause symptoms. In some cases, you may have: Pain in your chest, back, sides, or abdomen. If the pain is sudden and severe, it may mean that the aneurysm has ruptured. A cough or hoarseness. Shortness of breath. Trouble swallowing. Swelling in your face, arms, or  legs. Fainting. How is this diagnosed? This condition may be diagnosed based on: Ultrasound. X-rays. CT scan. MRI. Lab tests. Angiogram. This test checks your arteries for damage or blockage. Many aneurysms are found during exams for other conditions. How is this treated? Treatment depends on: The size of the aneurysm. How fast it is growing. Your age. Risk factors for rupture. If your aneurysm is smaller than 2.2 inches (5.5 cm), you may need: Medicines to: Control blood pressure. Treat pain. Fight infection. Control cholesterol. An ultrasound or CT scan every 6 months or year to see if the aneurysm is getting bigger. If your aneurysm is larger or growing fast, you may need surgery. Follow these instructions at home: Eating and drinking  Eat a healthy diet. You may need to: Take in less salt (sodium). Too much salt can raise your blood pressure and increase your risk for a TAA. Avoid foods that are high in saturated fat and cholesterol. These include red meat and some dairy products. Eat less sugar. Take in more fiber. You may need to eat more whole grains, vegetables, and fruit. Lifestyle Do not use any products that contain nicotine or tobacco. These products include cigarettes, chewing tobacco, and vaping devices, such as e-cigarettes. If you need help quitting, ask your health care provider. Check your blood pressure often. Follow instructions on how to keep it within normal limits. Have your blood sugar (glucose) level and cholesterol levels checked often. Exercise on a regular basis. Talk with your provider about how often  to exercise and which types of exercise are best for you. Maintain a healthy weight. Alcohol use Do not drink alcohol if: Your provider tells you not to drink. You are pregnant, may be pregnant, or plan to become pregnant. If you drink alcohol: Limit how much you have to: 0-1 drink a day if you are female. 0-2 drinks if you are female. Know how  much alcohol is in your drink. In the [place] one drink is one 12 oz bottle of beer (355 mL), one 5 oz glass of wine (148 mL), or one 1 oz glass of hard liquor (44 mL). General instructions Take over-the-counter and prescription medicines only as told by your provider. You may have to avoid lifting. Ask your provider how much you can safely lift. Keep all follow-up visits. Your provider will need to watch the size of your aneurysm and how fast it is growing. Contact a health care provider if: You lose weight and do not know why. You have trouble swallowing. You have a cough or hoarseness. Get help right away if: You have sudden, severe pain in your abdomen, side, or back. It may move into your chest and arms. You are short of breath. You feel light-headed, or you faint. You have any symptoms of a stroke. "BE FAST" is an easy way to remember the main warning signs of a stroke: B - Balance. Signs are dizziness, sudden trouble walking, or loss of balance. E - Eyes. Signs are trouble seeing or a sudden change in vision. F - Face. Signs are sudden weakness or numbness of the face, or the face or eyelid drooping on one side. A - Arms. Signs are weakness or numbness in an arm. This happens suddenly and usually on one side of the body. S - Speech. Signs are sudden trouble speaking, slurred speech, or trouble understanding what people say. T - Time. Time to call emergency services. Write down what time symptoms started. You have other signs of a stroke, such as: A sudden, severe headache with no known cause. Nausea or vomiting. Seizure. These symptoms may be an emergency. Get help right away. Call 911. Do not wait to see if the symptoms will go away. Do not drive yourself to the hospital. This information is not intended to replace advice given to you by your health care provider. Make sure you discuss any questions you have with your health care provider. Document Revised: 12/20/2021 Document  Reviewed: 12/20/2021 Elsevier Patient Education  2024 [employer].

## 2024-05-17 ENCOUNTER — Other Ambulatory Visit: Payer: Self-pay | Admitting: Family Medicine

## 2024-05-17 DIAGNOSIS — R6889 Other general symptoms and signs: Secondary | ICD-10-CM

## 2024-05-17 DIAGNOSIS — Z1231 Encounter for screening mammogram for malignant neoplasm of breast: Secondary | ICD-10-CM

## 2024-05-31 ENCOUNTER — Ambulatory Visit

## 2024-06-09 ENCOUNTER — Ambulatory Visit
Admission: RE | Admit: 2024-06-09 | Discharge: 2024-06-09 | Disposition: A | Discharge status: Home / Self Care | Source: Ambulatory Visit | Attending: Family Medicine | Admitting: Family Medicine

## 2024-06-09 DIAGNOSIS — R6889 Other general symptoms and signs: Secondary | ICD-10-CM | POA: Diagnosis present

## 2024-06-09 DIAGNOSIS — R413 Other amnesia: Secondary | ICD-10-CM | POA: Insufficient documentation

## 2024-06-09 DIAGNOSIS — Z8673 Personal history of transient ischemic attack (TIA), and cerebral infarction without residual deficits: Secondary | ICD-10-CM | POA: Diagnosis not present

## 2024-06-09 DIAGNOSIS — G319 Degenerative disease of nervous system, unspecified: Secondary | ICD-10-CM | POA: Diagnosis not present

## 2024-06-17 ENCOUNTER — Encounter

## 2024-08-16 ENCOUNTER — Ambulatory Visit: Payer: Self-pay | Admitting: Psychiatry
